# Patient Record
Sex: Female | Born: 1960 | Race: White | Hispanic: No | Marital: Married | Smoking: Never smoker
Health system: Southern US, Academic
[De-identification: ages and names within clinical notes are randomized; demographics above are authoritative.]

## PROBLEM LIST (undated history)

## (undated) DIAGNOSIS — F32A Depression, unspecified: Secondary | ICD-10-CM

## (undated) DIAGNOSIS — E119 Type 2 diabetes mellitus without complications: Secondary | ICD-10-CM

## (undated) DIAGNOSIS — K219 Gastro-esophageal reflux disease without esophagitis: Secondary | ICD-10-CM

## (undated) DIAGNOSIS — I639 Cerebral infarction, unspecified: Secondary | ICD-10-CM

## (undated) DIAGNOSIS — E7800 Pure hypercholesterolemia, unspecified: Secondary | ICD-10-CM

## (undated) DIAGNOSIS — G4730 Sleep apnea, unspecified: Secondary | ICD-10-CM

## (undated) DIAGNOSIS — I10 Essential (primary) hypertension: Secondary | ICD-10-CM

## (undated) HISTORY — DX: Type 2 diabetes mellitus without complications: E11.9

## (undated) HISTORY — DX: Sleep apnea, unspecified: G47.30

## (undated) HISTORY — DX: Essential (primary) hypertension: I10

## (undated) HISTORY — PX: MOLE REMOVAL: SHX2046

## (undated) HISTORY — PX: HX WISDOM TEETH EXTRACTION: SHX21

## (undated) HISTORY — DX: Pure hypercholesterolemia, unspecified: E78.00

## (undated) HISTORY — PX: HX TONSILLECTOMY: SHX27

## (undated) NOTE — Progress Notes (Signed)
 Formatting of this note is different from the original.  Subjective   Patient ID: Andrea Huerta is a 74 y.o. female presenting to the Urgent Care with a chief complaint of Nasal Congestion (Body aches and head feels foggy. Reports taking expired at home covid test that came back positive. ).    Mild cough and scratchy throat x today.  No fever.    History provided by:  Patient    Objective   BP 141/87 (BP Location: Left arm, Patient Position: Sitting, BP Cuff Size: Adult)   Pulse 100   Temp 36.8 C (98.2 F) (Temporal)   Resp 16   Ht 1.575 m (5' 2)   Wt 86.6 kg (191 lb)   SpO2 94%   BMI 34.93 kg/m     Physical Exam  Vitals and nursing note reviewed.   Constitutional:       General: She is not in acute distress.     Appearance: Normal appearance. She is not ill-appearing or toxic-appearing.   HENT:      Head: Normocephalic and atraumatic.      Right Ear: Tympanic membrane, ear canal and external ear normal.      Left Ear: Tympanic membrane, ear canal and external ear normal.      Mouth/Throat:      Mouth: Mucous membranes are moist.      Pharynx: Oropharynx is clear. Posterior oropharyngeal erythema present. No oropharyngeal exudate.   Cardiovascular:      Rate and Rhythm: Normal rate and regular rhythm.   Pulmonary:      Effort: Pulmonary effort is normal.      Breath sounds: Normal breath sounds.   Skin:     General: Skin is warm and dry.   Neurological:      Mental Status: She is alert.         Assessment & Plan    Assessment & Plan  COVID-19    Orders:    nirmatrelvir & ritonavir 20 x 150 MG & 10 x 100MG  tablet therapy pack; 3 tablets twice a day for 5 days.    Body aches    Orders:    POCT Covid Antigen    Congestion of nasal sinus            In-House Lab Results:     Results for orders placed or performed in visit on 03/06/24   POCT Covid Antigen    Collection Time: 03/06/24  7:06 PM   Result Value Ref Range    POCT Covid Antigen Positive (A) Negative       In-House Imaging Reads:        Procedure  Documentation:  Procedures     ED Course & MDM   MDM - Medical Decision Making: Home with return precautions  Electronically signed by Ozell Dallas Billing, MD at 03/06/2024  7:22 PM EDT

---

## 2010-04-10 ENCOUNTER — Other Ambulatory Visit: Payer: Self-pay

## 2010-04-13 LAB — HISTORICAL CYTOPATHOLOGY-GYN (PAP AND HPV TESTS)

## 2010-11-25 ENCOUNTER — Ambulatory Visit (HOSPITAL_COMMUNITY): Payer: Self-pay | Admitting: INTERNAL MEDICINE

## 2014-10-20 DIAGNOSIS — Z1272 Encounter for screening for malignant neoplasm of vagina: Secondary | ICD-10-CM

## 2014-10-20 HISTORY — DX: Encounter for screening for malignant neoplasm of vagina: Z12.72

## 2015-06-22 DIAGNOSIS — Z9289 Personal history of other medical treatment: Secondary | ICD-10-CM

## 2015-06-22 HISTORY — DX: Personal history of other medical treatment: Z92.89

## 2016-01-20 DIAGNOSIS — Z9289 Personal history of other medical treatment: Secondary | ICD-10-CM

## 2016-01-20 HISTORY — DX: Personal history of other medical treatment: Z92.89

## 2016-04-21 DIAGNOSIS — Z9289 Personal history of other medical treatment: Secondary | ICD-10-CM

## 2016-04-21 HISTORY — DX: Personal history of other medical treatment: Z92.89

## 2016-05-26 ENCOUNTER — Ambulatory Visit (INDEPENDENT_AMBULATORY_CARE_PROVIDER_SITE_OTHER): Payer: Self-pay | Admitting: Family Medicine

## 2016-07-15 ENCOUNTER — Encounter (INDEPENDENT_AMBULATORY_CARE_PROVIDER_SITE_OTHER): Payer: Self-pay | Admitting: Family Medicine

## 2016-07-15 ENCOUNTER — Ambulatory Visit (INDEPENDENT_AMBULATORY_CARE_PROVIDER_SITE_OTHER): Payer: No Typology Code available for payment source | Admitting: Family Medicine

## 2016-07-15 ENCOUNTER — Other Ambulatory Visit: Payer: No Typology Code available for payment source | Attending: Family Medicine

## 2016-07-15 VITALS — BP 130/82 | HR 96 | Ht 62.0 in | Wt 212.0 lb

## 2016-07-15 DIAGNOSIS — Z124 Encounter for screening for malignant neoplasm of cervix: Secondary | ICD-10-CM

## 2016-07-15 DIAGNOSIS — K219 Gastro-esophageal reflux disease without esophagitis: Secondary | ICD-10-CM

## 2016-07-15 DIAGNOSIS — F329 Major depressive disorder, single episode, unspecified: Secondary | ICD-10-CM

## 2016-07-15 DIAGNOSIS — I10 Essential (primary) hypertension: Principal | ICD-10-CM

## 2016-07-15 DIAGNOSIS — E785 Hyperlipidemia, unspecified: Secondary | ICD-10-CM

## 2016-07-15 DIAGNOSIS — F32A Depression, unspecified: Secondary | ICD-10-CM

## 2016-07-15 DIAGNOSIS — E119 Type 2 diabetes mellitus without complications: Secondary | ICD-10-CM

## 2016-07-15 NOTE — Progress Notes (Signed)
[facility]  [address]  [place] [address]  [phone]    07/16/2016    Andrea Huerta  December 03, 1960    Chief Complaint   Patient presents with   . Medical Information Needed     Previous PCP Dr. Precious Gilding saw her one time.    . Annual Pap     Pt has not had pap and mentioned it when she called in for her appointment wanting to know if we could do this.   . Annual Exam     Pt has PEIA would like to make this an annual physical.       HPI  Used to see Dr. Naida Sleight but had PEIA, had to move.  Went and saw Dr. Precious Gilding once.    Married for 30 years, 2 adult children.    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    Specialists: none.    On sertraline for anxiety.  Taking 75 mg.  Trying to wean off of this, now taking about 50 mg.  Tried going off of this before but had withdrawal side effects. Now on 50 mg for about 1 month.  She feels that her mood is okay.  She doesn't feel that she is ready to wean more.  She has been on 75 mg for about 15 years.    On lisinopril for hypertention.    On metformin for diabetes.  Tried byetta.  Went on this to try and lose weight.  Did not help, did not want to inject.     Lat pap smear was May/June 2016. Has had an abnormal pap smear.  Last menstrual cycle was over 1 year ago. She is hoping to be able to have this today. She did have hormone testing to see if she was menopausal.      Last labs in April 2017.  Her labs were available today.   Hemoglobin A1c was 6.1.  LDL was 93.  FSH 44.9, a low H 18.9 an estradiol 17.3    She was supposed to have Cologuard taken care of, never completed.  Has the kit at home.  Wants to know what she should do.    Past Medical History  Current Outpatient Prescriptions   Medication Sig   . ASCORBIC ACID (VITAMIN C ORAL) Once a day   . ASPIRIN (ASPIR-81 ORAL) Once a day   . CRANBERRY ORAL Once a day   . CYANOCOBALAMIN, VITAMIN B-12, (VITAMIN B-12  ORAL) Once a day   . GLUCOSAM/GLUC SU/AC-ALP-D-GLUC (GLUCOSAMINE COMPLEX ORAL) Once a day   . lisinopril (PRINIVIL) 20 mg Oral Tablet Take 1 Tab by mouth Once a day   . metFORMIN (GLUCOPHAGE) 500 mg Oral Tablet Take 1 Tab by mouth Twice daily with food   . MULTIVITAMIN (MULTIPLE VITAMINS ORAL) Once a day   . raNITIdine (ZANTAC) 300 mg Oral Tablet Take 1 Tab by mouth Once a day   . sertraline (ZOLOFT) 50 mg Oral Tablet Take 50 mg by mouth Once a day   . simvastatin (ZOCOR) 40 mg Oral Tablet Take 1 Tab by mouth Once a day   . vitamin E 100 unit Oral Capsule Take 100 Units by mouth Once a day     No Known Allergies  Past Medical History:   Diagnosis Date   . Diabetes mellitus, type 2 ([place])    . H/O complete eye exam 01/2016   .  H/O mammogram 2017   . High cholesterol    . History of dental examination 04/2016   . Hypertension    . Sleep apnea    . Vaginal Pap smear 10/2014         Past Surgical History:   Procedure Laterality Date   . HX CESAREAN SECTION     . HX TONSILLECTOMY     . HX WISDOM TEETH EXTRACTION     . MOLE REMOVAL      several all normal         Family Medical History     Problem Relation (Age of Onset)    Breast Cancer Mother    Diabetes Mother, Maternal Grandfather    High Cholesterol Mother, Brother    Hypertension Mother, Father, Brother    Liver Cancer Father            Social History     Social History   . Marital status: Married     Spouse name: N/A   . Number of children: N/A   . Years of education: N/A     Occupational History   . 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 Smoking status: Never Smoker   . Smokeless tobacco: Never Used   . Alcohol use Yes      Comment: 1 or 2 drinks per week   . Drug use: Not on file   . Sexual activity: Not on file     Other Topics Concern   . Not on file     Social History Narrative       BP 130/82  Pulse 96  Ht 1.575 m (5' 2")  Wt 96.2 kg (212 lb)  SpO2 97%  BMI 38.78 kg/m2    Nursing Notes:   Electa Sniff, [place]  07/15/16 1336  Signed      07/15/16 1336   Depression Screen   Little interest or pleasure in doing things. 0   Feeling down, depressed, or hopeless 0   PHQ 2 Total 0       Review of Systems   Constitutional: Negative for chills, fever and malaise/fatigue.   HENT: Negative for congestion, ear discharge, ear pain and tinnitus.    Eyes: Negative for double vision, discharge and redness.   Respiratory: Negative for cough, hemoptysis, sputum production, shortness of breath and wheezing.    Cardiovascular: Negative for chest pain, palpitations and orthopnea.   Gastrointestinal: Negative for constipation, diarrhea, nausea and vomiting.   Genitourinary: Negative for hematuria.   Musculoskeletal: Negative for myalgias.   Skin: Negative for rash.   Neurological: Negative for dizziness, tingling, tremors, weakness and headaches.   Psychiatric/Behavioral: Positive for depression. Negative for suicidal ideas. The patient is not nervous/anxious.    All other systems reviewed and are negative.      Physical Exam   Constitutional: She is oriented to person, place, and time and well-developed, well-nourished, and in no distress. No distress.   HENT:   Head: Normocephalic and atraumatic.   Eyes: EOM are normal.   Neck: Neck supple. No thyromegaly present.   Cardiovascular: Normal rate, regular rhythm and normal heart sounds.    No murmur heard.  Pulmonary/Chest: Effort normal and breath sounds normal. No respiratory distress. She has no wheezes. She has no rales. Right breast exhibits no inverted nipple, no mass and no nipple discharge. Left breast exhibits no inverted nipple, no mass and no nipple discharge.   Abdominal: Soft.  Bowel sounds are normal. She exhibits no distension. There is no tenderness.   Genitourinary: Uterus normal, cervix normal, right adnexa normal and left adnexa normal. Right adnexum displays no mass. Left adnexum displays no mass. Vagina exhibits no lesion.   Genitourinary Comments: Electa Sniff, CMA, present for exam.    Musculoskeletal: Normal range of motion. She exhibits no edema.   Neurological: She is alert and oriented to person, place, and time. Coordination normal.   Skin: Skin is warm. No rash noted. She is not diaphoretic. No cyanosis. Nails show no clubbing.   Psychiatric: Mood and affect normal.   Nursing note and vitals reviewed.        ICD-10-CM    1. Essential hypertension I10    2. Hyperlipidemia, unspecified hyperlipidemia type E78.5 COMPREHENSIVE METABOLIC PANEL, NON-FASTING     LIPID PANEL   3. Gastroesophageal reflux disease, esophagitis presence not specified K21.9    4. Diabetes mellitus (HCC) E11.9 HGA1C (HEMOGLOBIN A1C WITH EST AVG GLUCOSE)     MICROALBUMIN URINE, RANDOM   5. Pap smear for cervical cancer screening Z12.4 CYTOPATHOLOGY-GYN (PAP AND HPV TESTS)   6. Depression, unspecified depression type F32.9      He is controlled.  He should remain on her current medications.  She is due for lab work and I have ordered this for her.  She had a PE IA form.  She had this will be completed once her labs have resulted.  Pap smear was completed today.  She can continue on Zoloft 50 mg and she is not ready to taper at this time.  She has a Cologuard kit at home and was advised to complete this.She did not want to undergo colonoscopy.  Did advise her that I would not be able to bill today is a physical exam as we had a go over all of her problems today, she was okay with this.    Return in about 6 months (around 01/12/2017) for dm f/u.    Micki Riley, MD     I have reviewed this note in its entirety and agree with clinical staff on their assessments. Patient consented and agreed to the course of treatment. I have counseled patient and answered all questions.  The patient was informed to contact the office within 7 business days if a message/lab results/referral/imaging results have not been conveyed to the patient.

## 2016-07-15 NOTE — Nursing Note (Signed)
07/15/16 1336   Depression Screen   Little interest or pleasure in doing things. 0   Feeling down, depressed, or hopeless 0   PHQ 2 Total 0

## 2016-07-22 ENCOUNTER — Other Ambulatory Visit (INDEPENDENT_AMBULATORY_CARE_PROVIDER_SITE_OTHER): Payer: Self-pay | Admitting: Family Medicine

## 2016-07-22 LAB — HISTORICAL CYTOPATHOLOGY-GYN (PAP AND HPV TESTS)

## 2016-07-22 MED ORDER — METRONIDAZOLE 500 MG TABLET
500.00 mg | ORAL_TABLET | Freq: Two times a day (BID) | ORAL | 0 refills | Status: AC
Start: 2016-07-22 — End: 2016-07-29

## 2016-07-26 LAB — HUMAN PAPILLOMA VIRUS (HPV) BY PCR WITH HIGH RISK GENOTYPING (THINPREP)
HPV OTHER: NEGATIVE
HPV16 PCR: NEGATIVE
HPV18 PCR: NEGATIVE

## 2016-08-14 ENCOUNTER — Ambulatory Visit: Payer: No Typology Code available for payment source

## 2016-08-14 DIAGNOSIS — Z0000 Encounter for general adult medical examination without abnormal findings: Secondary | ICD-10-CM

## 2016-08-14 LAB — COMPREHENSIVE METABOLIC PNL, FASTING
ALBUMIN: 4.4 g/dL (ref 3.6–4.8)
ALKALINE PHOSPHATASE: 77 U/L (ref 38–126)
ALT (SGPT): 44 U/L (ref 3–45)
ANION GAP: 12 mmol/L
AST (SGOT): 23 U/L (ref 7–56)
BILIRUBIN TOTAL: 0.3 mg/dL (ref 0.2–1.3)
BUN/CREA RATIO: 23
BUN: 14 mg/dL (ref 7–18)
CALCIUM: 10 mg/dL (ref 8.5–10.3)
CHLORIDE: 104 mmol/L (ref 101–111)
CO2 TOTAL: 26 mmol/L (ref 22–31)
CREATININE: 0.6 mg/dL (ref 0.50–1.20)
ESTIMATED GFR: 104 mL/min/1.73mˆ2 (ref >=60)
GLUCOSE: 130 mg/dL — ABNORMAL HIGH (ref 68–99)
POTASSIUM: 4.9 mmol/L (ref 3.6–5.0)
PROTEIN TOTAL: 7 g/dL (ref 6.2–8.0)
SODIUM: 142 mmol/L (ref 137–145)

## 2016-08-14 LAB — CBC
HCT: 43 % (ref 37.0–47.0)
HGB: 14.4 g/dL (ref 12.0–16.0)
MCH: 30 pg (ref 27.0–31.0)
MCHC: 33.5 g/dL (ref 33.0–37.0)
MCV: 89.5 fL (ref 80.0–99.0)
PLATELETS: 243 x10ˆ3/uL (ref 130–400)
RBC: 4.8 x10?6/uL (ref 4.20–5.40)
RDW: 12.7 % (ref 11.5–14.5)
WBC: 3.9 x10?3/uL — ABNORMAL LOW (ref 4.8–10.8)

## 2016-08-14 LAB — LIPID PANEL
CHOLESTEROL: 170 mg/dL (ref 130–199)
HDL CHOL: 51 mg/dL (ref 40–59)
LDL CALC: 94 mg/dL (ref 60–129)
TRIGLYCERIDES: 123 mg/dL (ref 30–199)

## 2016-08-14 LAB — THYROID STIMULATING HORMONE (SENSITIVE TSH): TSH: 1.8 u[IU]/mL (ref 0.465–4.680)

## 2016-08-14 LAB — C-REACTIVE PROTEIN(CRP),HIGH SENSITIVITY,CARDIAC: CRP CARDIAC MARKER: 2 mg/L (ref 0.0–3.0)

## 2016-08-14 LAB — HGA1C (HEMOGLOBIN A1C WITH EST AVG GLUCOSE): HEMOGLOBIN A1C: 6.9 % — ABNORMAL HIGH (ref <5.7)

## 2016-08-30 ENCOUNTER — Other Ambulatory Visit (INDEPENDENT_AMBULATORY_CARE_PROVIDER_SITE_OTHER): Payer: Self-pay | Admitting: Family Medicine

## 2016-08-30 MED ORDER — LISINOPRIL 20 MG TABLET
20.0000 mg | ORAL_TABLET | Freq: Every day | ORAL | 1 refills | Status: DC
Start: 2016-08-30 — End: 2017-01-10

## 2016-08-30 NOTE — Telephone Encounter (Signed)
Regarding: Dr. Anne Fu  ----- Message from Marquette Old sent at 08/30/2016 11:48 AM EDT -----  Patient is requesting a refill on  lisinopril (PRINIVIL) 20 mg Oral Tablet, Take 1 Tab by mouth Once a day #90 with 1 refill

## 2016-10-09 ENCOUNTER — Other Ambulatory Visit (INDEPENDENT_AMBULATORY_CARE_PROVIDER_SITE_OTHER): Payer: Self-pay | Admitting: Family Medicine

## 2017-01-10 ENCOUNTER — Other Ambulatory Visit (INDEPENDENT_AMBULATORY_CARE_PROVIDER_SITE_OTHER): Payer: Self-pay | Admitting: Family Medicine

## 2017-01-12 ENCOUNTER — Ambulatory Visit (INDEPENDENT_AMBULATORY_CARE_PROVIDER_SITE_OTHER): Payer: No Typology Code available for payment source | Admitting: Family Medicine

## 2017-01-12 ENCOUNTER — Encounter (INDEPENDENT_AMBULATORY_CARE_PROVIDER_SITE_OTHER): Payer: Self-pay | Admitting: Family Medicine

## 2017-01-12 ENCOUNTER — Telehealth (INDEPENDENT_AMBULATORY_CARE_PROVIDER_SITE_OTHER): Payer: Self-pay | Admitting: Family Medicine

## 2017-01-12 ENCOUNTER — Other Ambulatory Visit (INDEPENDENT_AMBULATORY_CARE_PROVIDER_SITE_OTHER): Payer: Self-pay | Admitting: Family Medicine

## 2017-01-12 VITALS — BP 142/84 | HR 93 | Ht 62.0 in | Wt 209.0 lb

## 2017-01-12 DIAGNOSIS — N951 Menopausal and female climacteric states: Secondary | ICD-10-CM

## 2017-01-12 DIAGNOSIS — E119 Type 2 diabetes mellitus without complications: Secondary | ICD-10-CM

## 2017-01-12 DIAGNOSIS — I10 Essential (primary) hypertension: Secondary | ICD-10-CM

## 2017-01-12 DIAGNOSIS — F419 Anxiety disorder, unspecified: Secondary | ICD-10-CM

## 2017-01-12 DIAGNOSIS — Z1211 Encounter for screening for malignant neoplasm of colon: Secondary | ICD-10-CM

## 2017-01-12 DIAGNOSIS — Z1212 Encounter for screening for malignant neoplasm of rectum: Secondary | ICD-10-CM

## 2017-01-12 DIAGNOSIS — Z1231 Encounter for screening mammogram for malignant neoplasm of breast: Secondary | ICD-10-CM

## 2017-01-12 MED ORDER — BUSPIRONE 5 MG TABLET
5.00 mg | ORAL_TABLET | Freq: Three times a day (TID) | ORAL | 1 refills | Status: DC | PRN
Start: 2017-01-12 — End: 2017-03-07

## 2017-01-12 MED ORDER — ESTRADIOL 0.01% (0.1 MG/GRAM) VAGINAL CREAM: 2 g | Tube | VAGINAL | 1 refills | 0 days | Status: DC

## 2017-01-12 MED ORDER — SIMVASTATIN 40 MG TABLET
40.0000 mg | ORAL_TABLET | Freq: Every day | ORAL | 3 refills | Status: DC
Start: 2017-01-12 — End: 2017-10-10

## 2017-01-12 MED ORDER — RANITIDINE 300 MG TABLET
300.0000 mg | ORAL_TABLET | Freq: Every day | ORAL | 3 refills | Status: DC
Start: 2017-01-12 — End: 2017-10-10

## 2017-01-12 MED ORDER — BLOOD SUGAR DIAGNOSTIC STRIPS
ORAL_STRIP | 1 refills | Status: DC
Start: 2017-01-12 — End: 2020-12-31

## 2017-01-12 MED ORDER — LISINOPRIL 20 MG TABLET
ORAL_TABLET | ORAL | 3 refills | Status: DC
Start: 2017-01-12 — End: 2018-02-16

## 2017-01-12 MED ORDER — ESCITALOPRAM 20 MG TABLET
20.0000 mg | ORAL_TABLET | Freq: Every day | ORAL | 1 refills | Status: DC
Start: 2017-01-12 — End: 2017-07-07

## 2017-01-12 MED ORDER — METFORMIN 500 MG TABLET
500.0000 mg | ORAL_TABLET | Freq: Two times a day (BID) | ORAL | 3 refills | Status: DC
Start: 2017-01-12 — End: 2018-01-03

## 2017-01-12 NOTE — Telephone Encounter (Signed)
Regarding: Dr. Anne Fu  ----- Message from Myna Hidalgo sent at 01/12/2017 12:11 PM EDT -----  Current Outpatient Prescriptions:  raNITIdine (ZANTAC) 300 mg Oral Tablet, Take 1 Tab by mouth Once a day  simvastatin (ZOCOR) 40 mg Oral Tablet, Take 1 Tab by mouth Once a day

## 2017-01-12 NOTE — Progress Notes (Signed)
[facility]  [address]  [place] [address]  [phone]    01/14/2017    Andrea Huerta  Jan 04, 1961    Chief Complaint   Patient presents with   . Diabetes     Pt not checking daily out of strips. pt would like to have A1C done today.   . Depression     Pt thinks needs increase in medication. Pt having increased anxiety   . Vaginal Itching     Pt has itching sometimes and has more vaginal dryness.       HPI     She is here for routine follow up.      She is due for mammogram.     She has had tdap within the last 10 years.    SHe has been having some vaginal dryness and itching for the past month.  She has not been on anything over the counter. LMP 2-3 years ago.  Had pap smear several weeks ago.    She is currently on zoloft.  She has continued to try to taper.  Was on zoloft 50mg . Does not feel like she can discontinue zoloft at this time.  She has recently had some increased stress relating to her mother's ailing health.  She feels anxious at times.  Denies any suicidal ideations. SHe says that she was never on zoloft for depression, but rather, was on this for anxiety. At times, feels overly anxious.    Did not complete cologuard. She says that there was a mixup with the company and it still had her previous PCP name on the order and she did not want this result to go to the wrong doctor.    She has DM2, would like to have hga1c ordered.  She reports that her glucose levels are normally 150 or less but has not been checking because she ran out of test trips.  She is requesting a refill.  She denies any symptoms of hypoglycemia.      Past Medical History  Current Outpatient Prescriptions   Medication Sig   . ASCORBIC ACID (VITAMIN C ORAL) Once a day   . ASPIRIN (ASPIR-81 ORAL) Once a day   . Blood Sugar Diagnostic (ASCENSIA CONTOUR) Strip 1 Strip by Does not apply route Once a day   . Blood Sugar Diagnostic (ASCENSIA CONTOUR) Strip Test daily   . busPIRone (BUSPAR) 5 mg Oral Tablet  Take 1 Tab (5 mg total) by mouth Three times a day as needed   . CRANBERRY ORAL Once a day   . CYANOCOBALAMIN, VITAMIN B-12, (VITAMIN B-12 ORAL) Once a day   . escitalopram oxalate (LEXAPRO) 20 mg Oral Tablet Take 1 Tab (20 mg total) by mouth Once a day   . estradiol (ESTRACE) 0.01 % (0.1 mg/gram) Vaginal Cream 2 g by Vaginal route Every Monday, Wednesday and Friday   . GLUCOSAM/GLUC SU/AC-ALP-D-GLUC (GLUCOSAMINE COMPLEX ORAL) Once a day   . lisinopril (PRINIVIL) 20 mg Oral Tablet take 1 tablet by mouth once daily   . metFORMIN (GLUCOPHAGE) 500 mg Oral Tablet Take 1 Tab (500 mg total) by mouth Twice daily with food   . MULTIVITAMIN (MULTIPLE VITAMINS ORAL) Once a day   . raNITIdine (ZANTAC) 300 mg Oral Tablet Take 1 Tab (300 mg total) by mouth Once a day   . simvastatin (ZOCOR) 40 mg Oral Tablet Take 1 Tab (40 mg total) by mouth Once a day   . vitamin E 100 unit Oral  Capsule Take 100 Units by mouth Once a day     No Known Allergies  Past Medical History:   Diagnosis Date   . Diabetes mellitus, type 2 ([facility])    . H/O complete eye exam 01/2016   . H/O mammogram 2017   . High cholesterol    . History of dental examination 04/2016   . Hypertension    . Sleep apnea    . Vaginal Pap smear 10/2014         Past Surgical History:   Procedure Laterality Date   . HX CESAREAN SECTION     . HX TONSILLECTOMY     . HX WISDOM TEETH EXTRACTION     . MOLE REMOVAL      several all normal         Family Medical History     Problem Relation (Age of Onset)    Breast Cancer Mother    Diabetes Mother, Maternal Grandfather    High Cholesterol Mother, Brother    Hypertension Mother, Father, Brother    Liver Cancer Father            Social History     Social History   . Marital status: Married     Spouse name: N/A   . Number of children: N/A   . Years of education: N/A     Occupational History   . 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 Smoking status: Never Smoker   . Smokeless tobacco: Never Used   . Alcohol use Yes       Comment: 1 or 2 drinks per week   . Drug use: Not on file   . Sexual activity: Not on file     Other Topics Concern   . Not on file     Social History Narrative       BP (!) 142/84  Pulse 93  Ht 1.575 m (5\' 2" )  Wt 94.8 kg (209 lb)  SpO2 100%  BMI 38.23 kg/m2    Nursing Notes:   Electa Sniff, MA  01/12/17 1018  Signed     01/12/17 1000   A1C   A1C 6.4   References Ranges 4 - 6%   Initials SE       Review of Systems   Constitutional: Negative for chills, fever and malaise/fatigue.   HENT: Negative for congestion, ear discharge, ear pain and tinnitus.    Eyes: Negative for double vision, discharge and redness.   Respiratory: Negative for cough, hemoptysis, sputum production, shortness of breath and wheezing.    Cardiovascular: Negative for chest pain, palpitations and orthopnea.   Gastrointestinal: Negative for constipation, diarrhea, nausea and vomiting.   Genitourinary: Negative for hematuria.   Musculoskeletal: Negative for myalgias.   Skin: Negative for rash.   Neurological: Negative for dizziness, tingling, tremors, weakness and headaches.   Psychiatric/Behavioral: Negative for depression. The patient is nervous/anxious.    All other systems reviewed and are negative.      Physical Exam   Constitutional: She is oriented to person, place, and time and well-developed, well-nourished, and in no distress. No distress.   HENT:   Head: Normocephalic and atraumatic.   Eyes: EOM are normal.   Neck: Neck supple. No thyromegaly present.   Cardiovascular: Normal rate, regular rhythm and normal heart sounds.    No murmur heard.  Pulmonary/Chest: Effort normal and breath sounds normal. No respiratory distress. She has no wheezes. She  has no rales.   Musculoskeletal: Normal range of motion. She exhibits no edema.   Neurological: She is alert and oriented to person, place, and time. Coordination normal.   Skin: Skin is warm. No rash noted. She is not diaphoretic. No cyanosis. Nails show no clubbing.   Psychiatric:  Mood and affect normal.   Nursing note and vitals reviewed.        ICD-10-CM    1. Diabetes mellitus (CMS HCC) E11.9 HGA1C (HEMOGLOBIN A1C WITH EST AVG GLUCOSE)   2. Essential hypertension I10    3. Screening mammogram, encounter for Z12.31 MAMMO BILATERAL SCREENING-ADDL VIEWS/BREAST US AS REQ BY RAD   4. Anxiety F41.9    5. Vaginal dryness, menopausal N95.1    6. Screening for colorectal cancer Z12.11     Z12.12      Hemoglobin A1c was done in the office today this is 6.4.  She was encouraged to continue her current diabetic medications and work on a low carb ADA diet.  Blood pressure is controlled today.  Patient is due for mammogram.  In addition, she signed a new cologuard test order to have this done under my name.  Because of increased anxiety, recommended switching from Zoloft to Lexapro.  I did counsel her the possible side effects of the medication instructed her on how to make this switch.  She was prescribed BuSpar to be used as needed.  For her vaginal dryness, she has had a normal Pap smear and normal external exam recently.  I will start her on estrogen cream.  I did explain to her to try using this 3 times a week but then taper back to the lowest effective dose even if this means just once weekly.      Lab Requisition on 08/14/2016   Component Date Value Ref Range Status   . SODIUM 08/14/2016 142  137 - 145 mmol/L Final   . POTASSIUM 08/14/2016 4.9  3.6 - 5.0 mmol/L Final    Potassium Reference Ranges pertain to serum only.   Plasma results are 0.1 to 0.7 mmol/L lower than serum ranges.   . CHLORIDE 08/14/2016 104  101 - 111 mmol/L Final   . CO2 TOTAL 08/14/2016 26  22 - 31 mmol/L Final   . ANION GAP 08/14/2016 12  mmol/L Final   . BUN 08/14/2016 14  7 - 18 mg/dL Final   . CREATININE 08/14/2016 0.60  0.50 - 1.20 mg/dL Final   . BUN/CREA RATIO 08/14/2016 23   Final   . ESTIMATED GFR 08/14/2016 104  >=60 mL/min/1.30m^2 Final   . ALBUMIN 08/14/2016 4.4  3.6 - 4.8 g/dL Final   . CALCIUM 08/14/2016 10.0   8.5 - 10.3 mg/dL Final   . GLUCOSE 08/14/2016 130* 68 - 99 mg/dL Final   . ALKALINE PHOSPHATASE 08/14/2016 77  38 - 126 U/L Final   . ALT (SGPT) 08/14/2016 44  3 - 45 U/L Final   . AST (SGOT) 08/14/2016 23  7 - 56 U/L Final   . BILIRUBIN TOTAL 08/14/2016 0.3  0.2 - 1.3 mg/dL Final   . PROTEIN TOTAL 08/14/2016 7.0  6.2 - 8.0 g/dL Final   . WBC 08/14/2016 3.9* 4.8 - 10.8 x10^3/uL Final   . RBC 08/14/2016 4.80  4.20 - 5.40 x10^6/uL Final   . HGB 08/14/2016 14.4  12.0 - 16.0 g/dL Final   . HCT 08/14/2016 43.0  37.0 - 47.0 % Final   . MCV 08/14/2016 89.5  80.0 - 99.0 fL  Final   . MCH 08/14/2016 30.0  27.0 - 31.0 pg Final   . MCHC 08/14/2016 33.5  33.0 - 37.0 g/dL Final   . RDW 08/14/2016 12.7  11.5 - 14.5 % Final   . PLATELETS 08/14/2016 243  130 - 400 x10^3/uL Final   . TRIGLYCERIDES 08/14/2016 123  30 - 199 mg/dL Final    Triglyceride Classification:            <150    Normal       150-199 Borderline-High       200-499 High       >500    Very High   . CHOLESTEROL 08/14/2016 170  130 - 199 mg/dL Final    Total Cholesterol Classification:     <200        Desirable        200-239     Borderline High        > or = 240  High   . HDL CHOL 08/14/2016 51  40 - 59 mg/dL Final    HDL Cholesterol Classification:          <40        Low        > or = 60  High     . LDL CALC 08/14/2016 94  60 - 129 mg/dL Final    LDL Cholesterol Classification:         <100     Optimal       100-129  Near or Above Optimal       130-159  Borderline High       160-189  High       >190     Very High   . TSH 08/14/2016 1.800  0.465 - 4.680 uIU/mL Final    TSH Ranges  0 - 0.465 uIU/mL      Low  0.466 - 2.99 uIU/mL   Normal  3.00 - 4.68 uIU/mL    Borderline High  > 4.69 uIU/mL         High   . CRP CARDIAC MARKER 08/14/2016 2.0  0.0 - 3.0 mg/L Final    Classification for Cardiovascular Disease    Low:            < 1.0 mg/L  Average:        1.0 to 2.99 mg/L  High:           3.0 to 10.0 mg/L  Indeterminant:  > 10.0 mg/L   . HEMOGLOBIN A1C 08/14/2016 6.9*  <5.7 % Final    Hemoglobin A1c Ranges  Non-Diabetic:  <5.7  Increased Risk (pre-diabetic) for impaired glucose tolerance:  5.7 - 6.4  Consistent with diabetes (in not previously established diabetic patient:  > or = 6.5         No results were found from the past 30 days.        Micki Riley, MD     I have reviewed this note in its entirety and agree with clinical staff on their assessments. Patient consented and agreed to the course of treatment. I have counseled patient and answered all questions.  The patient was informed to contact the office within 7 business days if a message/lab results/referral/imaging results have not been conveyed to the patient.    This note may have been partially generated using MModal Fluency Direct system, and there may be some incorrect words, spellings, and punctuation that were not noted in checking the  note before saving, though effort was made to avoid such errors.

## 2017-01-12 NOTE — Telephone Encounter (Signed)
Patient states that he was supposed to have a cream sent to her pharmacy as well.    Georgena Spurling, [place]

## 2017-01-12 NOTE — Telephone Encounter (Signed)
I called Colog[employer] and they state that a new order needs to be sent in for new test and that the kit that the patient has is expired. Patient notified and states that she will come in and sign new form.    Andrea Huerta, [place]

## 2017-01-12 NOTE — Student (Signed)
Andrea Huerta is a 56 yo female presenting today for a routine follow up. She notes her glucose is ranging from 100 - 150 since her last visit but has requested an HbA1c run today in the office. She notes of no hypoglycemic instances since her last appt. Her last eye appt was last year and is going to schedule another one for this year. She has run out of test strips and has requested a refil today.     Her anxiety has increased since her last appointment with the stress of managing her mother's care recently. She feels her Zoloft 40m is no longer working as well.     Vaginal itching and dryness present for 2 months with mild intermittent dull pain due to scratching. LMP was 2-3 years ago and believes she has completed menopause. Denies dysuria or polyuria.     Believes her last mammogram was over 2 years ago and has requested to be scheduled for this year due to her mother hx of breast cancer.     She also notes of still having an unused Cologuard kit at home but did not know if she could use it since it was ordered y another physician. She has requested if we could switch the order to [place] she could complete this at this time. 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 SAnne Fuper the [employer] Discussed switching her Zoloft to Lexapro 242mwith addition of Buspirone 5 mg will help with her anxiety. Refilled her glucose test strips. Prescribed estrogen based cream for her atrophic vaginitis. Mammogram was ordered and instructed pt that hospital will call to set up the appt.  Follow up in 6-8 weeks or PRN.    LaThea SilversmithMED STUDENT       See my office note.  JeMicki RileyMD

## 2017-01-12 NOTE — Nursing Note (Signed)
01/12/17 1000   A1C   A1C 6.4   References Ranges 4 - 6%   Initials SE

## 2017-01-21 ENCOUNTER — Encounter (INDEPENDENT_AMBULATORY_CARE_PROVIDER_SITE_OTHER): Payer: Self-pay | Admitting: Family Medicine

## 2017-01-21 ENCOUNTER — Ambulatory Visit
Admission: RE | Admit: 2017-01-21 | Discharge: 2017-01-21 | Disposition: A | Discharge status: Home / Self Care | Payer: No Typology Code available for payment source | Source: Ambulatory Visit | Attending: Family Medicine | Admitting: Family Medicine

## 2017-01-21 ENCOUNTER — Encounter (HOSPITAL_BASED_OUTPATIENT_CLINIC_OR_DEPARTMENT_OTHER): Payer: Self-pay

## 2017-01-21 DIAGNOSIS — Z1231 Encounter for screening mammogram for malignant neoplasm of breast: Secondary | ICD-10-CM | POA: Insufficient documentation

## 2017-01-21 NOTE — Progress Notes (Signed)
Labs reviewed, unremarkable. Letter mailed.  Jalesa Thien Marie Staysha Truby, MD

## 2017-03-07 ENCOUNTER — Other Ambulatory Visit (INDEPENDENT_AMBULATORY_CARE_PROVIDER_SITE_OTHER): Payer: Self-pay | Admitting: Family Medicine

## 2017-03-09 ENCOUNTER — Ambulatory Visit (INDEPENDENT_AMBULATORY_CARE_PROVIDER_SITE_OTHER): Payer: No Typology Code available for payment source | Admitting: Family Medicine

## 2017-03-09 ENCOUNTER — Encounter (INDEPENDENT_AMBULATORY_CARE_PROVIDER_SITE_OTHER): Payer: Self-pay | Admitting: Family Medicine

## 2017-03-09 VITALS — BP 120/82 | HR 86 | Temp 98.1°F | Ht 62.0 in | Wt 213.0 lb

## 2017-03-09 DIAGNOSIS — F419 Anxiety disorder, unspecified: Secondary | ICD-10-CM

## 2017-03-09 DIAGNOSIS — N951 Menopausal and female climacteric states: Secondary | ICD-10-CM

## 2017-03-09 MED ORDER — ESTRADIOL 0.01% (0.1 MG/GRAM) VAGINAL CREAM
2.0000 g | TOPICAL_CREAM | VAGINAL | 1 refills | Status: DC
Start: 2017-03-09 — End: 2019-03-16

## 2017-03-09 NOTE — Nursing Note (Signed)
BMI addressed: Advised on diet, weight loss, and exercise to reduce above normal BMI.

## 2017-03-09 NOTE — Progress Notes (Signed)
[facility]  [address]  [place] [address]  [phone]    03/14/2017    Jackline Castilla  1960-09-22    Chief Complaint   Patient presents with    Follow Up Mood Check     pt states that her mood [place] vetter. Pt states that she has had some stressers like her mom being in the hospital 3 times over the summer and being rediagnosed with stage 1 breast cancer. Pt states that over all her mood is better with the new medication       HPI     Patient presents today for  Follow up of her mood.  At her last office visit less than 2 months ago, she was switched from zoloft to lexapro due to increased stress and anxiety.  She says that she has not been having any side effects and is happy with the switch.  She has not needed any buspar.     She was started on estrace cream for vaginal dryness. This has helped and has been taking it 3 times a week.  Wants to know if she can titrate this down a little in order to use the lowest effective dose.       Past Medical History  Current Outpatient Prescriptions   Medication Sig    ASPIRIN (ASPIR-81 ORAL) Once a day    Blood Sugar Diagnostic (ASCENSIA CONTOUR) Strip 1 Strip by Does not apply route Once a day    Blood Sugar Diagnostic (ASCENSIA CONTOUR) Strip Test daily    busPIRone (BUSPAR) 5 mg Oral Tablet take 1 tablet by mouth three times a day if needed    CRANBERRY ORAL Once a day    CYANOCOBALAMIN, VITAMIN B-12, (VITAMIN B-12 ORAL) Once a day    escitalopram oxalate (LEXAPRO) 20 mg Oral Tablet Take 1 Tab (20 mg total) by mouth Once a day    estradiol (ESTRACE) 0.01 % (0.1 mg/gram) Vaginal Cream 2 g by Vaginal route Every Monday, Wednesday and Friday    GLUCOSAM/GLUC SU/AC-ALP-D-GLUC (GLUCOSAMINE COMPLEX ORAL) Once a day    lisinopril (PRINIVIL) 20 mg Oral Tablet take 1 tablet by mouth once daily    metFORMIN (GLUCOPHAGE) 500 mg Oral Tablet Take 1 Tab (500 mg total) by mouth Twice daily with food    MULTIVITAMIN (MULTIPLE VITAMINS ORAL)  Once a day    raNITIdine (ZANTAC) 300 mg Oral Tablet Take 1 Tab (300 mg total) by mouth Once a day    simvastatin (ZOCOR) 40 mg Oral Tablet Take 1 Tab (40 mg total) by mouth Once a day    vitamin E 100 unit Oral Capsule Take 100 Units by mouth Once a day     No Known Allergies  Past Medical History:   Diagnosis Date    Diabetes mellitus, type 2 (CMS HCC)     H/O complete eye exam 01/2016    H/O mammogram 2017    High cholesterol     History of dental examination 04/2016    Hypertension     Sleep apnea     Vaginal Pap smear 10/2014         Past Surgical History:   Procedure Laterality Date    HX CESAREAN SECTION      HX TONSILLECTOMY      HX WISDOM TEETH EXTRACTION      MOLE REMOVAL      several all normal         Family Medical History  Problem Relation (Age of Onset)    Breast Cancer Mother    Diabetes Mother, Maternal Grandfather    High Cholesterol Mother, Brother    Hypertension Mother, Father, Brother    Liver Cancer Father    Lymphoma Maternal Aunt    Melanoma Maternal Aunt    No Known Problems Sister, Maternal Grandmother, Paternal Grandmother, Paternal Grandfather, Daughter, Son, Maternal Uncle, Paternal 27, Paternal Interior and spatial designer, Other            Social History     Social History    Marital status: Married     Spouse name: N/A    Number of children: N/A    Years of education: N/A     Occupational History    math Pharmacist, 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      Review of Systems   Constitutional: Negative for chills, fever and malaise/fatigue.   HENT: Negative for congestion, ear discharge, ear pain and tinnitus.    Eyes: Negative for double vision, discharge and redness.   Respiratory: Negative for cough, hemoptysis, sputum production, shortness of breath and wheezing.    Cardiovascular: Negative for chest pain, palpitations and orthopnea.   Gastrointestinal: Negative for constipation, diarrhea, nausea and vomiting.   Genitourinary: Negative for hematuria.   Musculoskeletal: Negative for myalgias.   Skin: Negative for rash.   Neurological: Negative for dizziness, tingling, tremors, weakness and headaches.   Psychiatric/Behavioral: Negative for depression. The patient is not nervous/anxious.    All other systems reviewed and are negative.      Physical Exam   Constitutional: She is oriented to person, place, and time and well-developed, well-nourished, and in no distress. No distress.   HENT:   Head: Normocephalic and atraumatic.   Eyes: EOM are normal.   Neck: Neck supple. No thyromegaly present.   Cardiovascular: Normal rate, regular rhythm and normal heart sounds.    No murmur heard.  Pulmonary/Chest: Effort normal and breath sounds normal. No respiratory distress. She has no wheezes. She has no rales.   Musculoskeletal: Normal range of motion. She exhibits no edema.   Neurological: She is alert and oriented to person, place, and time. Coordination normal.   Skin: Skin is warm. No rash noted. She is not diaphoretic. No cyanosis. Nails show no clubbing.   Psychiatric: Mood and affect normal.   Nursing note and vitals reviewed.        ICD-10-CM    1. Anxiety F41.9    2. Vaginal dryness, menopausal N95.1    1. Patient reports that her anxiety is better controlled.  Continue lexapro. May take buspar if needed.  2. Patient can use estradiol  cream less frequently and at half dose.  I explained to her that this is a cream that has a max dose and frequency and she can  choose to use a lot less, as long as is it effective for her.    No visits with results within 6 Month(s) from this visit.  Latest known visit with results is:    Lab Requisition on 08/14/2016   Component Date Value Ref Range Status    SODIUM 08/14/2016 142  137 - 145 mmol/L Final    POTASSIUM 08/14/2016 4.9  3.6 - 5.0 mmol/L Final    Potassium Reference Ranges pertain to serum only.   Plasma results are 0.1 to 0.7 mmol/L lower than serum ranges.    CHLORIDE 08/14/2016 104  101 - 111 mmol/L Final    CO2 TOTAL 08/14/2016 26  22 - 31 mmol/L Final    ANION GAP 08/14/2016 12  mmol/L Final    BUN 08/14/2016 14  7 - 18 mg/dL Final    CREATININE 08/14/2016 0.60  0.50 - 1.20 mg/dL Final    BUN/CREA RATIO 08/14/2016 23   Final    ESTIMATED GFR 08/14/2016 104  >=60 mL/min/1.41m2 Final    ALBUMIN 08/14/2016 4.4  3.6 - 4.8 g/dL Final    CALCIUM 08/14/2016 10.0  8.5 - 10.3 mg/dL Final    GLUCOSE 08/14/2016 130* 68 - 99 mg/dL Final    ALKALINE PHOSPHATASE 08/14/2016 77  38 - 126 U/L Final    ALT (SGPT) 08/14/2016 44  3 - 45 U/L Final    AST (SGOT) 08/14/2016 23  7 - 56 U/L Final    BILIRUBIN TOTAL 08/14/2016 0.3  0.2 - 1.3 mg/dL Final    PROTEIN TOTAL 08/14/2016 7.0  6.2 - 8.0 g/dL Final    WBC 08/14/2016 3.9* 4.8 - 10.8 x103/uL Final    RBC 08/14/2016 4.80  4.20 - 5.40 x106/uL Final    HGB 08/14/2016 14.4  12.0 - 16.0 g/dL Final    HCT 08/14/2016 43.0  37.0 - 47.0 % Final    MCV 08/14/2016 89.5  80.0 - 99.0 fL Final    MCH 08/14/2016 30.0  27.0 - 31.0 pg Final    MCHC 08/14/2016 33.5  33.0 - 37.0 g/dL Final    RDW 08/14/2016 12.7  11.5 - 14.5 % Final    PLATELETS 08/14/2016 243  130 - 400 x103/uL Final    TRIGLYCERIDES 08/14/2016 123  30 - 199 mg/dL Final    Triglyceride Classification:            <150    Normal       150-199 Borderline-High       200-499 High       >500    Very High    CHOLESTEROL 08/14/2016 170  130 - 199 mg/dL Final    Total Cholesterol Classification:     <200        Desirable         200-239     Borderline High        > or = 240  High    HDL CHOL 08/14/2016 51  40 - 59 mg/dL Final    HDL Cholesterol Classification:          <40        Low        > or = 60  High      LDL CALC 08/14/2016 94  60 - 129 mg/dL Final    LDL  Cholesterol Classification:         <100     Optimal       100-129  Near or Above Optimal       130-159  Borderline High       160-189  High       >190     Very High    TSH 08/14/2016 1.800  0.465 - 4.680 uIU/mL Final    TSH Ranges  0 - 0.465 uIU/mL      Low  0.466 - 2.99 uIU/mL   Normal  3.00 - 4.68 uIU/mL    Borderline High  > 4.69 uIU/mL         High    CRP CARDIAC MARKER 08/14/2016 2.0  0.0 - 3.0 mg/L Final    Classification for Cardiovascular Disease    Low:            < 1.0 mg/L  Average:        1.0 to 2.99 mg/L  High:           3.0 to 10.0 mg/L  Indeterminant:  > 10.0 mg/L    HEMOGLOBIN A1C 08/14/2016 6.9* <5.7 % Final    Hemoglobin A1c Ranges  Non-Diabetic:  <5.7  Increased Risk (pre-diabetic) for impaired glucose tolerance:  5.7 - 6.4  Consistent with diabetes (in not previously established diabetic patient:  > or = 6.5         No results were found from the past 30 days.    Return in about 4 months (around 07/09/2017) for dm, mood f/u .    Micki Riley, MD     I have reviewed this note in its entirety and agree with clinical staff on their assessments. Patient consented and agreed to the course of treatment. I have counseled patient and answered all questions.  The patient was informed to contact the office within 7 business days if a message/lab results/referral/imaging results have not been conveyed to the patient.    This note may have been partially generated using MModal Fluency Direct system, and there may be some incorrect words, spellings, and punctuation that were not noted in checking the note before saving, though effort was made to avoid such errors.

## 2017-03-14 ENCOUNTER — Other Ambulatory Visit (INDEPENDENT_AMBULATORY_CARE_PROVIDER_SITE_OTHER): Payer: Self-pay

## 2017-04-06 ENCOUNTER — Other Ambulatory Visit (INDEPENDENT_AMBULATORY_CARE_PROVIDER_SITE_OTHER): Payer: Self-pay | Admitting: Primary Care

## 2017-04-06 ENCOUNTER — Ambulatory Visit (INDEPENDENT_AMBULATORY_CARE_PROVIDER_SITE_OTHER): Payer: Self-pay | Admitting: Family Medicine

## 2017-04-06 NOTE — Telephone Encounter (Signed)
LM for patient. She is due for her flu shot this year. No record for this season's flu shot in chart.     Cruz Condon, LPN

## 2017-04-06 NOTE — Telephone Encounter (Signed)
Regarding: Dr. Anne Fu  ----- Message from [place] sent at 04/06/2017  8:15 AM EDT -----  Pt would like to know if she received her flu shot at her last visit on 9/19.  Call back number is [phone].

## 2017-04-06 NOTE — Telephone Encounter (Signed)
Chart states she is on lexapro now.     Louden Houseworth, APRN

## 2017-07-07 ENCOUNTER — Other Ambulatory Visit (INDEPENDENT_AMBULATORY_CARE_PROVIDER_SITE_OTHER): Payer: Self-pay | Admitting: Family Medicine

## 2017-07-20 ENCOUNTER — Encounter (INDEPENDENT_AMBULATORY_CARE_PROVIDER_SITE_OTHER): Payer: Self-pay | Admitting: Family Medicine

## 2017-07-21 ENCOUNTER — Other Ambulatory Visit (INDEPENDENT_AMBULATORY_CARE_PROVIDER_SITE_OTHER): Payer: Self-pay

## 2017-08-05 ENCOUNTER — Ambulatory Visit (INDEPENDENT_AMBULATORY_CARE_PROVIDER_SITE_OTHER): Payer: No Typology Code available for payment source | Admitting: Family Medicine

## 2017-08-05 ENCOUNTER — Encounter (INDEPENDENT_AMBULATORY_CARE_PROVIDER_SITE_OTHER): Payer: Self-pay | Admitting: Family Medicine

## 2017-08-05 VITALS — BP 120/88 | HR 91 | Temp 97.9°F | Ht 62.0 in | Wt 220.2 lb

## 2017-08-05 DIAGNOSIS — F419 Anxiety disorder, unspecified: Secondary | ICD-10-CM

## 2017-08-05 DIAGNOSIS — S058X1A Other injuries of right eye and orbit, initial encounter: Secondary | ICD-10-CM

## 2017-08-05 DIAGNOSIS — E119 Type 2 diabetes mellitus without complications: Secondary | ICD-10-CM

## 2017-08-05 DIAGNOSIS — E785 Hyperlipidemia, unspecified: Secondary | ICD-10-CM

## 2017-08-05 DIAGNOSIS — I10 Essential (primary) hypertension: Secondary | ICD-10-CM

## 2017-08-05 MED ORDER — CIPROFLOXACIN 0.3 % EYE DROPS
1.00 [drp] | OPHTHALMIC | 1 refills | Status: DC
Start: 2017-08-05 — End: 2019-03-16

## 2017-08-05 NOTE — Nursing Note (Signed)
BMI addressed: Advised on diet, weight loss, and exercise to reduce above normal BMI.

## 2017-08-05 NOTE — Progress Notes (Signed)
[facility]  [address]  [place] [address]  [phone]    08/05/2017    Andrea Huerta  February 13, 1961    Chief Complaint   Patient presents with   . Follow Up Mood Check     Pt wants to discuss increasing lexapro slightly.    . Diabetes   . Eye Pain     Pt feels she may have scratched her eye. Felt like a grain of sand in her eye at one point but its red now.        HPI     Last seen in September.  She is on lexapro for stress and anxiety.  She has tried zoloft. This did not help.  She continues to have some high stress levels related to trying to take care of her mother.  She says that she does not always have irritability and cranky missed and feel overwhelmed like she did prior to Lexapro.  She does not feel that she needs to have a med change necessary early but wanted to know if there is any chance of increasing the Lexapro.  She says that she has not been taking BuSpar.  She has not tried it.    She has type 2 diabetes.  She has not had recent labwork.  A hga1c was ordered in July 2018, not done.  Her last hga1c is from feb 2018, 6.9%.  She denies any symptoms of hypoglycemia.  She is not checking her glucose levels.  She remains on lisinopril 20 mg for blood pressure.  She does not check her blood pressure.  She is also on a statin medication and denies any myalgias.    She wears glasses all of the time, but yesterday, felt that something was in her eye, like a grain of sand.  Yesterday, was red.  Now feels itchy.  She sees Dr. Barbie Banner- across the street from [facility] (on [address]       Past Medical History  Current Outpatient Medications   Medication Sig   . ASPIRIN (ASPIR-81 ORAL) Once a day   . Blood Sugar Diagnostic (ASCENSIA CONTOUR) Strip 1 Strip by Does not apply route Once a day   . Blood Sugar Diagnostic (ASCENSIA CONTOUR) Strip Test daily   . busPIRone (BUSPAR) 5 mg Oral Tablet take 1 tablet by mouth three times a day if needed   . ciprofloxacin HCl (CILOXIN) 0.3 %  Ophthalmic Drops Instill 1 Drop into both eyes Every 2 hours   . CRANBERRY ORAL Once a day   . escitalopram oxalate (LEXAPRO) 20 mg Oral Tablet take 1 tablet by mouth once daily   . estradiol (ESTRACE) 0.01 % (0.1 mg/gram) Vaginal Cream 2 g by Vaginal route Every Monday, Wednesday and Friday   . GLUCOSAM/GLUC SU/AC-ALP-D-GLUC (GLUCOSAMINE COMPLEX ORAL) Once a day   . lisinopril (PRINIVIL) 20 mg Oral Tablet take 1 tablet by mouth once daily   . metFORMIN (GLUCOPHAGE) 500 mg Oral Tablet Take 1 Tab (500 mg total) by mouth Twice daily with food   . MULTIVITAMIN (MULTIPLE VITAMINS ORAL) Once a day   . raNITIdine (ZANTAC) 300 mg Oral Tablet Take 1 Tab (300 mg total) by mouth Once a day   . simvastatin (ZOCOR) 40 mg Oral Tablet Take 1 Tab (40 mg total) by mouth Once a day   . vitamin E 100 unit Oral Capsule Take 100 Units by mouth Once a day     No Known Allergies  Past  Medical History:   Diagnosis Date   . Diabetes mellitus, type 2 (CMS HCC)    . H/O complete eye exam 01/2016   . H/O mammogram 2017   . High cholesterol    . History of dental examination 04/2016   . Hypertension    . Sleep apnea    . Vaginal Pap smear 10/2014         Past Surgical History:   Procedure Laterality Date   . HX CESAREAN SECTION     . HX TONSILLECTOMY     . HX WISDOM TEETH EXTRACTION     . MOLE REMOVAL      several all normal         Family Medical History:     Problem Relation (Age of Onset)    Breast Cancer Mother    Diabetes Mother, Maternal Grandfather    High Cholesterol Mother, Brother    Hypertension Mother, Father, Brother    Liver Cancer Father    Lymphoma Maternal Aunt    Melanoma Maternal Aunt    No Known Problems Sister, Maternal Grandmother, Paternal Grandmother, Paternal 54, Daughter, Son, Maternal Uncle, Paternal 82, Paternal Uncle, Other            Social History     Socioeconomic History   . Marital status: Married     Spouse name: Not on file   . Number of children: Not on file   . Years of education: Not on file      . Highest education level: Not on file   Social Needs   . Financial resource strain: Not on file   . Food insecurity - worry: Not on file   . Food insecurity - inability: Not on file   . Transportation needs - medical: Not on file   . Transportation needs - non-medical: Not on file   Occupational History   . Occupation: math Buyer, retail   Tobacco Use   . Smoking status: Never Smoker   . Smokeless tobacco: Never Used   Substance and Sexual Activity   . Alcohol use: Yes     Comment: 1 or 2 drinks per week   . Drug use: Not on file   . Sexual activity: Not on file   Other Topics Concern   . Not on file   Social History Narrative   . Not on file       BP 120/88 (Site: Left)   Pulse 91   Temp 36.6 C (97.9 F) (Oral)   Ht 1.575 m (5\' 2" )   Wt 99.9 kg (220 lb 3.2 oz)   LMP  (LMP Unknown)   SpO2 99%   BMI 40.28 kg/m         Nursing Notes:   Adelfa Koh, [place]  08/05/17 0847  Signed  BMI addressed: Advised on diet, weight loss, and exercise to reduce above normal BMI.      Review of Systems   Constitutional: Negative for chills, fever and malaise/fatigue.   HENT: Negative for congestion, ear discharge, ear pain and tinnitus.    Eyes: Negative for double vision, discharge and redness.   Respiratory: Negative for cough, hemoptysis, sputum production, shortness of breath and wheezing.    Cardiovascular: Negative for chest pain, palpitations and orthopnea.   Gastrointestinal: Negative for constipation, diarrhea, nausea and vomiting.   Genitourinary: Negative for hematuria.   Musculoskeletal: Negative for myalgias.   Skin: Negative for rash.   Neurological: Negative for dizziness, tingling, tremors, weakness and  headaches.   Psychiatric/Behavioral: Negative for depression and suicidal ideas. The patient is nervous/anxious.    All other systems reviewed and are negative.      Physical Exam   Constitutional: She is oriented to person, place, and time. No distress.   HENT:   Head: Normocephalic and atraumatic.   Eyes:  EOM are normal.   Right eye- wood's lamp examination using flurosceine:  Patient has some uptake in lower mid conjunctiva   Neck: Neck supple. No thyromegaly present.   Cardiovascular: Normal rate, regular rhythm and normal heart sounds.   No murmur heard.  Pulmonary/Chest: Effort normal and breath sounds normal. No respiratory distress. She has no wheezes. She has no rales.   Musculoskeletal: Normal range of motion. She exhibits no edema.   Neurological: She is alert and oriented to person, place, and time. Coordination normal.   Diabetic foot exam:  Both feet without edema or ulcerations. Pulses normal bilaterally. Sensation normal bilaterally No edema bilaterally. No ulcerations or lesions bilaterally. Pulses normal bilaterally. Sensation normal bilaterally.          Skin: Skin is warm. No rash noted. She is not diaphoretic. No cyanosis. Nails show no clubbing.   Psychiatric: She has a normal mood and affect. Her behavior is normal.   Nursing note and vitals reviewed.        ICD-10-CM    1. Abrasion of sclera of right eye S05.8X1A    2. Essential hypertension I10    3. Anxiety F41.9    4. Diabetes mellitus (CMS HCC) E11.9 MICROALBUMIN URINE, RANDOM     HGA1C (HEMOGLOBIN A1C WITH EST AVG GLUCOSE)   5. Hyperlipidemia, unspecified hyperlipidemia type E78.5 COMPREHENSIVE METABOLIC PANEL, NON-FASTING     LIPID PANEL    I am starting patient on ciprofloxacin drops for her right eye.  Recommended that she call follow-up with her eye doctor within the next 1-2 days.  She verbalized understanding.  Blood pressure is controlled.  In terms of her anxiety, I informed her that 20 mg as a max dose of Lexapro.  I will for switch to Celexa but she declined.  I suggested then to try taking BuSpar 5 mg consistently every morning.  She says she will try this and call back in the next several weeks of this does not improve her anxiety.  Her she was encouraged to adhere to a low carb ADA diet.  She is past due for lab work and I will  adjust her medications accordingly.    No visits with results within 6 Month(s) from this visit.   Latest known visit with results is:   Lab Requisition on 08/14/2016   Component Date Value Ref Range Status   . SODIUM 08/14/2016 142  137 - 145 mmol/L Final   . POTASSIUM 08/14/2016 4.9  3.6 - 5.0 mmol/L Final    Potassium Reference Ranges pertain to serum only.   Plasma results are 0.1 to 0.7 mmol/L lower than serum ranges.   . CHLORIDE 08/14/2016 104  101 - 111 mmol/L Final   . CO2 TOTAL 08/14/2016 26  22 - 31 mmol/L Final   . ANION GAP 08/14/2016 12  mmol/L Final   . BUN 08/14/2016 14  7 - 18 mg/dL Final   . CREATININE 08/14/2016 0.60  0.50 - 1.20 mg/dL Final   . BUN/CREA RATIO 08/14/2016 23   Final   . ESTIMATED GFR 08/14/2016 104  >=60 mL/min/1.40m^2 Final   . ALBUMIN 08/14/2016 4.4  3.6 -  4.8 g/dL Final   . CALCIUM 08/14/2016 10.0  8.5 - 10.3 mg/dL Final   . GLUCOSE 08/14/2016 130* 68 - 99 mg/dL Final   . ALKALINE PHOSPHATASE 08/14/2016 77  38 - 126 U/L Final   . ALT (SGPT) 08/14/2016 44  3 - 45 U/L Final   . AST (SGOT) 08/14/2016 23  7 - 56 U/L Final   . BILIRUBIN TOTAL 08/14/2016 0.3  0.2 - 1.3 mg/dL Final   . PROTEIN TOTAL 08/14/2016 7.0  6.2 - 8.0 g/dL Final   . WBC 08/14/2016 3.9* 4.8 - 10.8 x10^3/uL Final   . RBC 08/14/2016 4.80  4.20 - 5.40 x10^6/uL Final   . HGB 08/14/2016 14.4  12.0 - 16.0 g/dL Final   . HCT 08/14/2016 43.0  37.0 - 47.0 % Final   . MCV 08/14/2016 89.5  80.0 - 99.0 fL Final   . MCH 08/14/2016 30.0  27.0 - 31.0 pg Final   . MCHC 08/14/2016 33.5  33.0 - 37.0 g/dL Final   . RDW 08/14/2016 12.7  11.5 - 14.5 % Final   . PLATELETS 08/14/2016 243  130 - 400 x10^3/uL Final   . TRIGLYCERIDES 08/14/2016 123  30 - 199 mg/dL Final    Triglyceride Classification:            <150    Normal       150-199 Borderline-High       200-499 High       >500    Very High   . CHOLESTEROL 08/14/2016 170  130 - 199 mg/dL Final    Total Cholesterol Classification:     <200        Desirable        200-239      Borderline High        > or = 240  High   . HDL CHOL 08/14/2016 51  40 - 59 mg/dL Final    HDL Cholesterol Classification:          <40        Low        > or = 60  High     . LDL CALC 08/14/2016 94  60 - 129 mg/dL Final    LDL Cholesterol Classification:         <100     Optimal       100-129  Near or Above Optimal       130-159  Borderline High       160-189  High       >190     Very High   . TSH 08/14/2016 1.800  0.465 - 4.680 uIU/mL Final    TSH Ranges  0 - 0.465 uIU/mL      Low  0.466 - 2.99 uIU/mL   Normal  3.00 - 4.68 uIU/mL    Borderline High  > 4.69 uIU/mL         High   . CRP CARDIAC MARKER 08/14/2016 2.0  0.0 - 3.0 mg/L Final    Classification for Cardiovascular Disease    Low:            < 1.0 mg/L  Average:        1.0 to 2.99 mg/L  High:           3.0 to 10.0 mg/L  Indeterminant:  > 10.0 mg/L   . HEMOGLOBIN A1C 08/14/2016 6.9* <5.7 % Final    Hemoglobin A1c Ranges  Non-Diabetic:  <5.7  Increased Risk (pre-diabetic) for impaired glucose tolerance:  5.7 - 6.4  Consistent with diabetes (in not previously established diabetic patient:  > or = 6.5         No results were found from the past 30 days.    Return in about 6 months (around 02/02/2018) for dm f/u .    Micki Riley, MD     I have reviewed this note in its entirety and agree with clinical staff on their assessments. Patient consented and agreed to the course of treatment. I have counseled patient and answered all questions.  The patient was informed to contact the office within 7 business days if a message/lab results/referral/imaging results have not been conveyed to the patient.    This note may have been partially generated using MModal Fluency Direct system, and there may be some incorrect words, spellings, and punctuation that were not noted in checking the note before saving, though effort was made to avoid such errors.

## 2017-09-06 ENCOUNTER — Encounter (INDEPENDENT_AMBULATORY_CARE_PROVIDER_SITE_OTHER): Payer: Self-pay | Admitting: Family Medicine

## 2017-09-06 NOTE — Nursing Note (Signed)
Patient is a diabetic.  States that she does not have an up to date dilated retinal eye exam. We discussed the reason/importance of having such exams.  Patient verbalized understanding and agreed.  States that she will schedule one as soon as possible.  FYI added to chart.  Gust Eugene, MA

## 2017-09-13 ENCOUNTER — Telehealth (INDEPENDENT_AMBULATORY_CARE_PROVIDER_SITE_OTHER): Payer: Self-pay | Admitting: Family Medicine

## 2017-09-13 NOTE — Telephone Encounter (Signed)
Per HEDIS guidelines the patient is DUE for DIABETIC EYE EXAM.  Prestina Raigoza, MA - [facility] Quality Management

## 2017-10-10 ENCOUNTER — Other Ambulatory Visit (INDEPENDENT_AMBULATORY_CARE_PROVIDER_SITE_OTHER): Payer: Self-pay | Admitting: Family Medicine

## 2017-10-10 NOTE — Telephone Encounter (Signed)
Patient was last seen in the office on 08/05/17.

## 2018-01-03 ENCOUNTER — Other Ambulatory Visit (INDEPENDENT_AMBULATORY_CARE_PROVIDER_SITE_OTHER): Payer: Self-pay | Admitting: Family Medicine

## 2018-01-23 ENCOUNTER — Encounter (INDEPENDENT_AMBULATORY_CARE_PROVIDER_SITE_OTHER): Payer: Self-pay | Admitting: Family Medicine

## 2018-02-16 ENCOUNTER — Other Ambulatory Visit (INDEPENDENT_AMBULATORY_CARE_PROVIDER_SITE_OTHER): Payer: Self-pay | Admitting: Family Medicine

## 2018-05-30 ENCOUNTER — Other Ambulatory Visit (INDEPENDENT_AMBULATORY_CARE_PROVIDER_SITE_OTHER): Payer: Self-pay

## 2018-05-30 ENCOUNTER — Encounter (INDEPENDENT_AMBULATORY_CARE_PROVIDER_SITE_OTHER): Payer: Self-pay | Admitting: Family Medicine

## 2018-11-25 ENCOUNTER — Other Ambulatory Visit: Payer: 59

## 2018-11-25 ENCOUNTER — Other Ambulatory Visit (HOSPITAL_BASED_OUTPATIENT_CLINIC_OR_DEPARTMENT_OTHER): Payer: Self-pay

## 2018-11-25 DIAGNOSIS — D2271 Melanocytic nevi of right lower limb, including hip: Secondary | ICD-10-CM | POA: Insufficient documentation

## 2018-11-28 ENCOUNTER — Other Ambulatory Visit: Payer: Self-pay

## 2018-11-28 ENCOUNTER — Other Ambulatory Visit: Payer: 59

## 2018-11-29 LAB — HISTORICAL SURGICAL PATHOLOGY SPECIMEN

## 2018-12-26 ENCOUNTER — Other Ambulatory Visit (HOSPITAL_BASED_OUTPATIENT_CLINIC_OR_DEPARTMENT_OTHER): Payer: Self-pay | Admitting: Family Medicine

## 2019-01-02 ENCOUNTER — Other Ambulatory Visit (HOSPITAL_BASED_OUTPATIENT_CLINIC_OR_DEPARTMENT_OTHER): Payer: Self-pay | Admitting: Family Medicine

## 2019-01-02 DIAGNOSIS — F419 Anxiety disorder, unspecified: Secondary | ICD-10-CM

## 2019-01-02 DIAGNOSIS — I10 Essential (primary) hypertension: Secondary | ICD-10-CM

## 2019-01-02 NOTE — Telephone Encounter (Signed)
Regarding: Dr. Russ Halo  ----- Message from Moses Manners sent at 01/02/2019 10:31 AM EDT -----   Patient requesting refill sent to [facility] [address] [place] [address] for:        This request is per Burman Nieves           #escitalopram oxalate (LEXAPRO) 20 mg Oral Tablet, take 1 tablet by mouth once daily     #simvastatin (ZOCOR) 40 mg Oral Tablet, take 1 tablet by mouth once daily         Appt:  03/16/19

## 2019-01-03 MED ORDER — ESCITALOPRAM 20 MG TABLET
ORAL_TABLET | ORAL | 0 refills | Status: DC
Start: 2019-01-03 — End: 2019-04-05

## 2019-01-03 MED ORDER — SIMVASTATIN 40 MG TABLET
ORAL_TABLET | ORAL | 0 refills | Status: DC
Start: 2019-01-03 — End: 2019-04-05

## 2019-01-05 ENCOUNTER — Ambulatory Visit (HOSPITAL_BASED_OUTPATIENT_CLINIC_OR_DEPARTMENT_OTHER): Payer: Self-pay | Admitting: PHYSICIAN ASSISTANT

## 2019-01-05 NOTE — Nursing Note (Signed)
this patients medication has been called into the pharmacy.      Andrea Huerta

## 2019-01-05 NOTE — Telephone Encounter (Signed)
Regarding: Dr. Sheppard Coil  ----- Message from Moses Manners sent at 01/05/2019  8:43 AM EDT -----  Patient called about this again today:  (She is requesting this medication be called to her pharmacy)            Patient requesting refill sent to [facility] [address] for:       This request is per Burman Nieves          #escitalopram oxalate (LEXAPRO) 20 mg Oral Tablet, take 1 tablet by mouth once daily    #simvastatin (ZOCOR) 40 mg Oral Tablet, take 1 tablet by mouth once daily        Appt: 03/16/19

## 2019-02-20 ENCOUNTER — Other Ambulatory Visit (HOSPITAL_BASED_OUTPATIENT_CLINIC_OR_DEPARTMENT_OTHER): Payer: Self-pay | Admitting: PHYSICIAN ASSISTANT

## 2019-02-20 MED ORDER — LISINOPRIL 20 MG TABLET
ORAL_TABLET | ORAL | 0 refills | Status: DC
Start: 2019-02-20 — End: 2019-02-22

## 2019-02-20 NOTE — Telephone Encounter (Signed)
Regarding: Andrea Huerta  ----- Message from Laurance Flatten sent at 02/20/2019  3:56 PM EDT -----  Med refill    #lisinopril (PRINIVIL) 20 mg Oral Tablet, take 1 tablet by mouth once daily    Preferred [place], [place] [address]   [facility] [address]    [place] [address]    Phone: [phone] Fax: [phone]    Not a 24 hour pharmacy; exact hours not known.

## 2019-02-22 ENCOUNTER — Other Ambulatory Visit (HOSPITAL_BASED_OUTPATIENT_CLINIC_OR_DEPARTMENT_OTHER): Payer: Self-pay | Admitting: PHYSICIAN ASSISTANT

## 2019-02-22 MED ORDER — LISINOPRIL 20 MG TABLET
ORAL_TABLET | ORAL | 0 refills | Status: DC
Start: 2019-02-22 — End: 2019-03-19

## 2019-02-22 NOTE — Telephone Encounter (Signed)
Regarding: Andrea Huerta  ----- Message from Laurance Flatten sent at 02/22/2019  3:41 PM EDT -----  Has not had prescription called in yet. Claims she called yesterday. Takes last pill tomorrow.    #lisinopriL (PRINIVIL) 20 mg Oral Tablet, Take 1 tablet by mouth once daily    Preferred [place], [place] [address]   [facility] [address]    [place] [address]    Phone: [phone] Fax: [phone]    Not a 24 hour pharmacy; exact hours not known.

## 2019-02-22 NOTE — Telephone Encounter (Signed)
After LITERALLY 25 minutes on hold with [facility] trying to find out if they received this Rx or not, I opted to simply resend it.  Thank you.

## 2019-03-15 ENCOUNTER — Other Ambulatory Visit: Payer: Self-pay

## 2019-03-16 ENCOUNTER — Ambulatory Visit (HOSPITAL_BASED_OUTPATIENT_CLINIC_OR_DEPARTMENT_OTHER): Payer: 59 | Admitting: PHYSICIAN ASSISTANT

## 2019-03-16 ENCOUNTER — Encounter (HOSPITAL_BASED_OUTPATIENT_CLINIC_OR_DEPARTMENT_OTHER): Payer: Self-pay | Admitting: PHYSICIAN ASSISTANT

## 2019-03-16 ENCOUNTER — Other Ambulatory Visit (HOSPITAL_BASED_OUTPATIENT_CLINIC_OR_DEPARTMENT_OTHER): Payer: 59

## 2019-03-16 ENCOUNTER — Ambulatory Visit: Payer: 59 | Attending: PHYSICIAN ASSISTANT

## 2019-03-16 ENCOUNTER — Ambulatory Visit (HOSPITAL_BASED_OUTPATIENT_CLINIC_OR_DEPARTMENT_OTHER): Payer: Self-pay | Admitting: PHYSICIAN ASSISTANT

## 2019-03-16 VITALS — BP 128/98 | HR 89 | Temp 97.9°F | Ht 62.0 in | Wt 220.0 lb

## 2019-03-16 DIAGNOSIS — Z7984 Long term (current) use of oral hypoglycemic drugs: Secondary | ICD-10-CM

## 2019-03-16 DIAGNOSIS — Z23 Encounter for immunization: Secondary | ICD-10-CM

## 2019-03-16 DIAGNOSIS — E119 Type 2 diabetes mellitus without complications: Secondary | ICD-10-CM | POA: Insufficient documentation

## 2019-03-16 DIAGNOSIS — Z9189 Other specified personal risk factors, not elsewhere classified: Secondary | ICD-10-CM

## 2019-03-16 DIAGNOSIS — Z7689 Persons encountering health services in other specified circumstances: Secondary | ICD-10-CM

## 2019-03-16 DIAGNOSIS — Z6841 Body Mass Index (BMI) 40.0 and over, adult: Secondary | ICD-10-CM | POA: Insufficient documentation

## 2019-03-16 DIAGNOSIS — F419 Anxiety disorder, unspecified: Secondary | ICD-10-CM | POA: Insufficient documentation

## 2019-03-16 DIAGNOSIS — Z1239 Encounter for other screening for malignant neoplasm of breast: Secondary | ICD-10-CM

## 2019-03-16 DIAGNOSIS — Z1159 Encounter for screening for other viral diseases: Secondary | ICD-10-CM | POA: Insufficient documentation

## 2019-03-16 DIAGNOSIS — N898 Other specified noninflammatory disorders of vagina: Secondary | ICD-10-CM

## 2019-03-16 DIAGNOSIS — I10 Essential (primary) hypertension: Secondary | ICD-10-CM | POA: Insufficient documentation

## 2019-03-16 DIAGNOSIS — F329 Major depressive disorder, single episode, unspecified: Secondary | ICD-10-CM

## 2019-03-16 DIAGNOSIS — F32A Depression, unspecified: Secondary | ICD-10-CM

## 2019-03-16 DIAGNOSIS — E785 Hyperlipidemia, unspecified: Secondary | ICD-10-CM

## 2019-03-16 DIAGNOSIS — Z114 Encounter for screening for human immunodeficiency virus [HIV]: Secondary | ICD-10-CM | POA: Insufficient documentation

## 2019-03-16 LAB — COMPREHENSIVE METABOLIC PNL, FASTING
ALBUMIN: 4.6 g/dL (ref 3.6–4.8)
ALKALINE PHOSPHATASE: 85 U/L (ref 38–126)
ALT (SGPT): 39 U/L (ref 3–45)
ANION GAP: 13 mmol/L
AST (SGOT): 27 U/L (ref 7–56)
BILIRUBIN TOTAL: 0.5 mg/dL (ref 0.2–1.3)
BUN/CREA RATIO: 21
BUN: 15 mg/dL (ref 7–18)
CALCIUM: 9.7 mg/dL (ref 8.5–10.3)
CHLORIDE: 103 mmol/L (ref 101–111)
CO2 TOTAL: 23 mmol/L (ref 22–31)
CREATININE: 0.7 mg/dL (ref 0.50–1.20)
ESTIMATED GFR: 97 mL/min/{1.73_m2} (ref >=60)
GLUCOSE: 221 mg/dL — ABNORMAL HIGH (ref 68–99)
POTASSIUM: 4.4 mmol/L (ref 3.6–5.0)
PROTEIN TOTAL: 7.1 g/dL (ref 6.2–8.0)
SODIUM: 139 mmol/L (ref 137–145)

## 2019-03-16 LAB — URINALYSIS, MICROSCOPIC

## 2019-03-16 LAB — URINALYSIS, MACROSCOPIC
BILIRUBIN: NOT DETECTED mg/dL
BLOOD: NOT DETECTED mg/dL
GLUCOSE: NOT DETECTED mg/dL
KETONES: NOT DETECTED mg/dL
NITRITE: NOT DETECTED
PH: 6 (ref 5.0–8.0)
PROTEIN: NOT DETECTED mg/dL
SPECIFIC GRAVITY: 1.008 (ref 1.005–1.030)
UROBILINOGEN: NOT DETECTED mg/dL

## 2019-03-16 LAB — CBC WITH DIFF
BASOPHIL #: 0 10*3/uL (ref 0.00–1.00)
BASOPHIL %: 1 % (ref 0–1)
EOSINOPHIL #: 0.1 10*3/uL (ref 0.00–0.40)
EOSINOPHIL %: 2 % (ref 1–5)
HCT: 41.1 % (ref 37.0–47.0)
HGB: 13.6 g/dL (ref 12.0–16.0)
LYMPHOCYTE #: 1.2 10*3/uL (ref 1.00–4.00)
LYMPHOCYTE %: 30 % (ref 25–40)
MCH: 30.1 pg (ref 27.0–31.0)
MCHC: 33.1 g/dL (ref 33.0–37.0)
MCV: 91 fL (ref 80.0–99.0)
MONOCYTE #: 0.3 10*3/uL (ref 0.10–0.80)
MONOCYTE %: 7 % (ref 4–10)
NEUTROPHIL #: 2.4 10*3/uL (ref 1.80–7.00)
NEUTROPHIL %: 60 % (ref 50–73)
PLATELETS: 206 10*3/uL (ref 130–400)
RBC: 4.52 10*6/uL (ref 4.20–5.40)
RDW: 13 % (ref 11.5–14.5)
WBC: 4 10*3/uL — ABNORMAL LOW (ref 4.8–10.8)

## 2019-03-16 LAB — LIPID PANEL
CHOLESTEROL: 173 mg/dL (ref 130–199)
HDL CHOL: 50 mg/dL (ref 40–59)
LDL CALC: 91 mg/dL (ref 60–129)
TRIGLYCERIDES: 159 mg/dL (ref 30–199)

## 2019-03-16 LAB — MICROALBUMIN/CREATININE RATIO, URINE, RANDOM
CREATININE RANDOM URINE: 42 mg/dL — ABNORMAL LOW (ref 80–180)
MICROALBUMIN RANDOM URINE: <0.6 mg/dL

## 2019-03-16 LAB — HIV1/HIV2 SCREEN, COMBINED ANTIGEN AND ANTIBODY: HIV 1/2 COMBO (CCM): NONREACTIVE

## 2019-03-16 LAB — HEPATITIS C ANTIBODY
HCV ANTIBODY QUALITATIVE: NONREACTIVE
HCV ANTIBODY QUANTITATIVE: 0.02 (ref <1.00)

## 2019-03-16 MED ORDER — BUSPIRONE 5 MG TABLET
5.00 mg | ORAL_TABLET | Freq: Three times a day (TID) | ORAL | 1 refills | Status: DC | PRN
Start: 2019-03-16 — End: 2022-12-02

## 2019-03-16 MED ORDER — METFORMIN 1,000 MG TABLET
1000.00 mg | ORAL_TABLET | Freq: Two times a day (BID) | ORAL | 1 refills | Status: DC
Start: 2019-03-16 — End: 2019-08-08

## 2019-03-16 NOTE — Telephone Encounter (Signed)
Regarding: Dr. Russ Halo  ----- Message from Myna Hidalgo sent at 03/16/2019  3:57 PM EDT -----  Patient states she has a question about the Urinalysis she had done today.  Patient would like for nurse to give her a call.

## 2019-03-16 NOTE — Patient Instructions (Signed)
Atrophic Vaginitis    Atrophic vaginitis means the walls of your vagina have become thin. This happens when your body makes too little of the hormone estrogen. Menopause or surgical removal of the ovaries are the most common causes for a drop in estrogen. Breastfeeding can also cause the hormone level to drop.   Symptoms of atrophic vaginitis include:   Dryness, soreness, burning, or itching in the vagina   Vaginal discharge  Sex can be uncomfortable, even painful. After sex, you may have bleeding from your vagina. You may also have burning or pain when you urinate.   Home care  Your healthcare provider may recommend one or more of these as treatment:    Vaginal creams, lotions, and lubricants.These products help relieve vaginal dryness. They don't need a prescription. They can be found in the personal care section of most pharmacies. Creams and lotions are used daily to help keep the vagina moist. Lubricants help reduce dryness and pain during sex. Choose water-based lubricants. Don't use petroleum jelly, mineral oil, or other oils. These increase the chance of infection.   Hormone therapy (HT).HT increases the amount of estrogen in your body. This can help manage or relieve symptoms. HT can be given in pill form. It may be given as a lotion, cream, ring put into the vagina, or a patch on the skin. The risks and benefits of HT vary for each woman. For instance, your risk may be higher if you have had breast cancer. Discuss this treatment with your healthcare provider. Not every woman can use HT.  You don't need to stop having sex. In fact, regular sex can help keep vaginal tissues healthy. Take steps to make sex more comfortable by using water-based lubricants.   Preventing infections  Atrophic vaginitis makes an infection of the vagina or the urinary tract more likely. To help reduce your risk:    Keep your genitals clean. When you bathe, wash the outside of your vagina with mild soap and water. Clean  gently between the folds of your vagina.   Wipe from front to back after a bowel movement.   Don't douche unless your healthcare provider tells you to.   Don't use scented toilet paper, scented vaginal sprays, or scented tampons.   Don't wear clothes that are tight in the crotch. These include pantyhose, jeans, and leggings. Wear cotton underwear. Change it every day.  Follow-up care  Follow up with your healthcare provider, or as advised.  When to seek medical advice  Call your health care provider right away if any of these occur:   Fever of 100.57F (38C) or higher, or as directed by your healthcare provider   Symptoms don't go away or get worse even with treatment   Vaginal area swells or becomes painful   Vaginal area bleeds, but not because of your period   Bad-smelling discharge from the vagina   Pain or burning when you urinate or you have trouble passing urine   Open sores develop around vagina  [employer]Well last reviewed this educational content on 03/21/2018   2000-2020 [employer]. [address], [place] [address]. All rights reserved. This information is not intended as a substitute for professional medical care. Always follow your healthcare professional's instructions.        Weight Management: Overcoming Your Barriers    You may have many reasons why you're not ready to lose weight. You may not feel you have the time or the skills. You may  be afraid of losing weight and gaining it back again. Well, you can lose weight. And you can keep the weight off, if you make changes slowly and stick with them.Remember that you may never find the perfect time to lose weight. Decide that the right time to be healthier is now.   Common barriers  Barrier 1: I don't want to deny myself.  Barrier Buster: You don't have to! Moderation is the key:   Watch portion sizes and know when you're eating more than one serving.   Plan to ask for a doggy bag when you eat out.   Have just  one.   Read the labels on foods to know what foods may be hiding calories or salt.   Choose lower-fat and lower-calorie versions of your favorites.   Use a small plate instead of a normal-sized plate.  Barrier 2: I lost weight before but I gained it right back.  Barrier Buster: Make this time different:   List what worked and Interior and spatial designer time and what you can try this time.   Choose changes that you are willing to stick with.   Work exercise into your weight-loss plan.   Be realistic about what is possible.Your plan has to fit into your life in a balanced way that works for you.  Barrier 3: I don't have the time to be active.  Barrier Buster: It takes just a few minutes a day!   Be active with a pet or the kids.   Block off activity time in your schedule.   Borrow some time that you usually spend watching TV.   You are too important not to take time to exercise--it is your life!  Feel good about yourself  Do you eat more because you feel bad about yourself, then feel even worse as you gain weight? This is a "vicious cycle." Breaking this cycle is not easy. You may need group support or counseling. Always remember that you are a valuable person, no matter what size or shape you are.   Do you have a health problem? If so, don't use it as an excuse for not losing weight. Ask your healthcare provider or dietitian about methods to lose weight that are safe for you. For example, even if you have severe arthritis, it may be easier for you to exercise in a pool. Get advice from a fitness professional.   Rolene Arbour last reviewed this educational content on 09/19/2016   2000-2020 [employer]. [address] [place] [address]. All rights reserved. This information is not intended as a substitute for professional medical care. Always follow your healthcare professional's instructions.

## 2019-03-16 NOTE — Telephone Encounter (Signed)
Checked her urinalysis, concerned about her abnormal levels. Told her as soon as it had been reviewed by doctor, someone would give her a call.     Hal Neer, LPN

## 2019-03-16 NOTE — Nursing Note (Signed)
03/16/19 0800   Urine test  (Siemens Multistix 10 SG)   Time collected 0836   Color Yellow   Clarity Cloudy   Glucose Negative   Bilirubin Negative   Ketones Negative   Urine Specific Gravity 1.015   Blood (urine) Negative   pH 5.5   Protein Negative   Urobilinogen 0.2mg /dL (Normal)   Nitrite Negative   Leukocytes (!) 1+   Initials ABR, LPN

## 2019-03-16 NOTE — Progress Notes (Signed)
My Chart message sent

## 2019-03-16 NOTE — Progress Notes (Signed)
[place] [place]  [place]  [place] [address]  [phone]    Patient Name: Andrea Huerta  Date: 03/16/2019  MRN: [id number]  DOB: 11-06-1960    Chief complaint: Establish Care (DMII, HTN, urinary symptoms)    HPI:  The patient is a 58 y.o. old female who came in today to establish care. She is a previous patient of Dr. Anne Fu. Patient is married and lives in [place], [place]. 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 Has 2 children, ages 24 and 38. 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 She has not been to see her previous PCP, Dr. Anne Fu, since 07/2017. Today, we discuss the following complaints:    Diabetes mellitus, type II: Last HbA1C was 6.9% on 08/14/2016. Patient denies symptoms of polydipsia, polyuria, hypoglycemic unawareness, chest pain, dyspnea on exertion, diarrhea, foot ulcerations, paresthesias. Symptoms are not changed. Patient's home blood sugars have been elevated. Patient is compliant with medications as prescribed, metformin 500 mg BID. She admits to noncompliance with a diabetic diet as well, and has gained weight since her last encounter with Dr. Anne Fu. Complications are not known to include retinopathy, nephropathy, or neuropathy. She does have multiple co-morbidities for ASCVD, including hyperlipidemia, obesity, and hypertension.     Essential Hypertension: Blood pressure today in office is BP (Non-Invasive): (!) 128/98. Patient denies symptoms of chest pain, dyspnea, dyspnea on exertion, lower extremity edema, tinnitus, headache, signs/symptoms of stroke, confusion, visual disturbance, dizziness. Patient is compliant with medications (lisinopril 20 mg daily), and reports no medication side effects.     Vaginal itching: Patient states that she has had intermittent burning with urination and vaginal "itching". She has not tried any OTC creams or medications for  her symptoms. She does not report fevers/chills, abdominal pain, nausea/vomiting, hematuria. She was previous taking estrogen cream. Reports some dyspareunia, but no bleeding after intercourse. Postmenopausal. She denies thick vaginal discharge. She worries about the side effects of estrogen supplementation. Last Pap was 07/15/2016, HPV and cytology were negative. She reports an abnormal Pap in the distant past, but last couple have been normal.     ROS:  Constitutional: Denies unintentional weight gain/loss, fever, chills, fatigue, tiredness  Skin: Denies skin problems or rashes, itching  Musculoskeletal: Denies unilateral muscle weakness, atrophy, joint pain/swelling  HEENT: Denies blurred vision, vision loss, nasal congestion, otalgia, hearing loss, epistaxis, sore throat, difficulty swallowing  Cardiovascular: Denies heart valve problems/murmurs, irregular heartbeat, angina/chest pain, swelling in feet, swelling in hands, shortness of breath lying flat, short of breath climbing stairs, palpitations  Respiratory: Denies shortness of breath, productive cough, hemoptysis, wheezing  Gastrointestinal: Denies abdominal pain, diarrhea, constipation, vomiting blood, black or tarry stools, nausea, GERD  Genitourinary: +burning with urination, vaginal itching. Denies hematuria, change in urinary stream, urinary frequency, incontinence, nocturia, sexual dysfunction  Neurologic: Denies headache, seizures, stroke, dizziness, loss of consciousness, paresthesias, numbness  Psychiatric: Denies anxiety, depression, memory loss, suicidal/homicidal thoughts, insomnia  Endocrine: Denies polydipsia, polyuria, thyroid problems, heat intolerance, cold intolerance    Past medical history:  Past Medical History:   Diagnosis Date   . Diabetes mellitus, type 2 (CMS HCC)    . H/O complete eye exam 01/2016   . H/O mammogram 2017   . High cholesterol    . History of dental examination 04/2016   . Hypertension    . Sleep apnea    . Vaginal Pap  smear 10/2014     Past surgical history:  Past Surgical History:   Procedure Laterality Date   . HX CESAREAN SECTION     . HX TONSILLECTOMY     . HX WISDOM TEETH EXTRACTION     . MOLE REMOVAL      several all normal     Medications:  Current Outpatient Medications   Medication Sig   . ASPIRIN (ASPIR-81 ORAL) Once a day   . Blood Sugar Diagnostic (ASCENSIA CONTOUR) Strip 1 Strip by Does not apply route Once a day   . Blood Sugar Diagnostic (ASCENSIA CONTOUR) Strip Test daily   . busPIRone (BUSPAR) 5 mg Oral Tablet Take 1 Tab (5 mg total) by mouth Three times a day as needed (anxiety)   . escitalopram oxalate (LEXAPRO) 20 mg Oral Tablet take 1 tablet by mouth once daily   . famotidine (PEPCID) 20 mg Oral Tablet Take 20 mg by mouth Twice daily   . GLUCOSAM/GLUC SU/AC-ALP-D-GLUC (GLUCOSAMINE COMPLEX ORAL) Once a day   . lisinopriL (PRINIVIL) 20 mg Oral Tablet Take 1 tablet by mouth once daily   . metFORMIN (GLUCOPHAGE) 1,000 mg Oral Tablet Take 1 Tab (1,000 mg total) by mouth Twice daily with food   . MULTIVITAMIN (MULTIPLE VITAMINS ORAL) Once a day   . simvastatin (ZOCOR) 40 mg Oral Tablet take 1 tablet by mouth once daily   . vitamin E 100 unit Oral Capsule Take 100 Units by mouth Once a day     Allergies:  No Known Allergies     Family history:  Family Medical History:     Problem Relation (Age of Onset)    Breast Cancer Mother    Diabetes Mother, Maternal Grandfather    High Cholesterol Mother, Brother    Hypertension (High Blood Pressure) Mother, Father, Brother    Liver Cancer Father    Lymphoma Maternal Aunt    Melanoma Maternal Aunt    No Known Problems Sister, Maternal Grandmother, Paternal Grandmother, Paternal 79, Daughter, Son, Maternal Uncle, Paternal 20, Paternal Uncle, Other        Social history:  Social History     Socioeconomic History   . Marital status: Married     Spouse name: Not on file   . Number of children: Not on file   . Years of education: Not on file   . Highest education level:  Not on file   Occupational History   . Occupation: math Buyer, retail   Tobacco Use   . Smoking status: Never Smoker   . Smokeless tobacco: Never Used   Substance and Sexual Activity   . Alcohol use: Yes     Comment: 1 or 2 drinks per week       Vitals:    03/16/19 0816 03/16/19 0906   BP: (!) 146/94 (!) 128/98   Pulse: 89    Temp: 36.6 C (97.9 F)    TempSrc: Tympanic    SpO2: 98%    Weight: 99.8 kg (220 lb)    Height: 1.575 m (5\' 2" )    BMI: 40.32      Physical Examination:    GENERAL: Pt is a pleasant, well-nourished, well-developed 58 y.o. female who is in NAD. Appears stated age  52:  head normocephalic, symmetrical facies. EOM intact b/l. PERRLA. Sclera non-icteric, non-injected. No rhinorrhea. Oropharyngeal mucous membranes are moist  w/o erythema/exudates   NECK: supple. No masses, lymphadenopathy, JVD on exam. Trachea midline, no thyromegaly   CV: S1, S2.  No murmurs, rubs, gallops.     LUNGS: CTAB, No rhonchi, rales, wheezes.   GI: (+) BS in all 4 quadrants. soft, NT/ND. No rigidity/ guarding/ rebound. No organomegaly/masses, or abdominal bruits  GU: deferred  MSK: Spontaneous normal AROM of major joints as observed during exam. No joint effusions/ swelling/ deformities.   No BLE edema, clubbing, or cyanosis.   2+ radial pulse present b/l.   + 2 reflexes Brachioradialis and patellar bilaterally.   Gait normal.  SKIN: No significant lesions, rashes, ecchymoses noted.   NEURO: AAOx4, CN grossly intact. Sensation intact b/l. No focal deficits.  PSYCH: Mood, behavior and affect normal w/ Intact judgement & insight     Diabetes Monitors  A1C: 8  A1C Date: 03/16/2019           Nephropathy Screening: On ACEI or ARB    Last Lipid Panel  (Last result in the past 2 years)      Cholesterol   HDL   LDL   Direct LDL   Triglycerides      03/16/19 0923 173  Comment:  Total Cholesterol Classification:     <200        Desirable        200-239     Borderline High        > or = 240  High 50  Comment:  HDL Cholesterol  Classification:          <40        Low        > or = 60  High   91  Comment:  LDL Cholesterol Classification:         <100     Optimal       100-129  Near or Above Optimal       130-159  Borderline High       160-189  High       >190     Very High   159  Comment:  Triglyceride Classification:            <150    Normal       150-199 Borderline-High       200-499 High       >500    Very High        Retinal Exam Date: 05/29/2018  Last diabetic foot exam: 08/05/2017    Visit Diagnosis    Encounter to establish care with new doctor  Transfer from Dr. Anne Fu.   Lives in [place] with her husband. [employer] Music therapist for [employer].   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 Family friends of Dr. Russ Halo.     Type 2 diabetes mellitus without complication, without long-term current use of insulin (CMS HCC)  Chronic, uncontrolled. HbA1C increased from 6.9% to 8.0% today.   Patient admits to poor compliance with diabetic diet. Encouraged decrease carbohydrate and sugar intake. Encouraged increased activity for weight loss.   Check blood sugars only as needed and for reference for her dietary changes.   Increase metformin from 500 mg BID to 1000 mg BID. Discussed that this increases risk of potential side effects and encouraged her to reach out if this becomes an issue.   Continue lisinopril 20 mg daily.   Continue simvastatin 40 mg daily.   Will need foot exam.   Patient follows with ophthalmologist annually.     Essential hypertension  Chronic, uncontrolled. Encouraged lifestyle modifications.   Continue behavioral efforts, esp  Cardiac  friendly diet,   < 2 gm salt q day.   Exercise 20 min q day most days of the week.  Continue ACEI - lisinopril 20 mg daily.   Labs ordered.     Anxiety and depression  Chronic, controlled with medication.   No SI/HI thoughts voiced at this time.   Continue Lexapro 20 mg daily.   PHQ next encounter.   Restart BuSpar 5 mg TID PRN for breakthrough anxiety.     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   Urine dipped in office - small leukocytes. Culture sent. Will treat based on culture.   Patient is UTD on screening Pap, however, was invited back to the office in 2-3 weeks for pelvic exam if symptoms persist.     Screening for HIV (human immunodeficiency virus)  Encounter for hepatitis C virus screening test for high risk patient  Discussed HIV and HCV screening with patient in office.   Ordered with labs.     Screening for breast cancer  Mammogram ordered.     Orders Placed This Encounter   . URINE CULTURE   . MAMMO BILATERAL SCREENING-ADDL VIEWS/BREAST US AS REQ BY RAD   . CBC/DIFF   . COMPREHENSIVE METABOLIC PNL, FASTING   . LIPID PANEL   . MICROALBUMIN/CREATININE RATIO, URINE, RANDOM   . HEPATITIS C ANTIBODY   . HIV1/HIV2 SCREEN, COMBINED ANTIGEN AND ANTIBODY   . URINALYSIS, MACROSCOPIC   . CANCELED: POCT URINE DIPSTICK   . POCT HGB A1C   . POCT URINE DIPSTICK   . busPIRone (BUSPAR) 5 mg Oral Tablet   . metFORMIN (GLUCOPHAGE) 1,000 mg Oral Tablet     Return in about 3 months (around 06/15/2019), or if symptoms worsen or fail to improve, for DMII, HTN.    Chryl Heck, PA-C  03/16/2019, 11:04  Electronically signed by Chryl Heck, PA-C

## 2019-03-16 NOTE — Nursing Note (Signed)
03/16/19 0800   Urine test  (Siemens Multistix 10 SG)   Time collected 0836   Color Yellow   Clarity Cloudy   Glucose Negative   Bilirubin Negative   Ketones Negative   Urine Specific Gravity 1.015   Blood (urine) Negative   pH 5.5   Protein Negative   Urobilinogen 0.2mg /dL (Normal)   Nitrite Negative   Leukocytes (!) 1+   Initials ABR, LPN   [id number]   Time Performed 0838   A1C 8.0   Initials ABR, LPN

## 2019-03-18 LAB — URINE CULTURE,ROUTINE: URINE CULTURE: 50000

## 2019-03-19 ENCOUNTER — Other Ambulatory Visit (HOSPITAL_BASED_OUTPATIENT_CLINIC_OR_DEPARTMENT_OTHER): Payer: Self-pay | Admitting: PHYSICIAN ASSISTANT

## 2019-03-19 MED ORDER — LISINOPRIL 20 MG TABLET
20.0000 mg | ORAL_TABLET | Freq: Every day | ORAL | 1 refills | Status: DC
Start: 2019-03-19 — End: 2019-08-16

## 2019-03-19 NOTE — Telephone Encounter (Signed)
Regarding: Andrea Huerta  ----- Message from Laurance Flatten sent at 03/19/2019 10:44 AM EDT -----  Med refill. Also would like [facility] taken off of pref pharm list    #lisinopriL (PRINIVIL) 20 mg Oral Tablet, Take 1 tablet by mouth once daily    Preferred Pharmacy     [id number] [address] [place]    [address] [place] [address]    Phone: [phone] Fax: [phone]    Not a 24 hour pharmacy; exact hours not known.

## 2019-03-19 NOTE — Telephone Encounter (Signed)
Please see result note.   No infection, only normal skin bacteria grew.   We discussed this possibility (contaminant) during her appointment.   Let me know if she has any other questions.     Chryl Heck, PA-C  03/19/2019, 08:28

## 2019-03-19 NOTE — Progress Notes (Signed)
My Chart message sent

## 2019-03-19 NOTE — Telephone Encounter (Signed)
Spoke with patient about her concerns, voiced understanding.     Hal Neer, LPN

## 2019-04-03 ENCOUNTER — Other Ambulatory Visit (HOSPITAL_BASED_OUTPATIENT_CLINIC_OR_DEPARTMENT_OTHER): Payer: Self-pay | Admitting: PHYSICIAN ASSISTANT

## 2019-04-03 DIAGNOSIS — I10 Essential (primary) hypertension: Secondary | ICD-10-CM

## 2019-04-03 DIAGNOSIS — F419 Anxiety disorder, unspecified: Secondary | ICD-10-CM

## 2019-04-05 ENCOUNTER — Other Ambulatory Visit (HOSPITAL_BASED_OUTPATIENT_CLINIC_OR_DEPARTMENT_OTHER): Payer: Self-pay | Admitting: PHYSICIAN ASSISTANT

## 2019-04-05 DIAGNOSIS — F419 Anxiety disorder, unspecified: Secondary | ICD-10-CM

## 2019-04-05 DIAGNOSIS — I10 Essential (primary) hypertension: Secondary | ICD-10-CM

## 2019-04-05 NOTE — Telephone Encounter (Signed)
Regarding: Dr. Russ Halo   ----- Message from Lacie Draft sent at 04/05/2019 11:22 AM EDT -----  escitalopram oxalate (LEXAPRO) 20 mg Oral Tablet, take 1 tablet by mouth once daily  simvastatin (ZOCOR) 40 mg Oral Tablet, take 1 tablet by mouth once daily  [facility]eds [facility] [address] patient is switching pharmacy

## 2019-04-06 MED ORDER — SIMVASTATIN 40 MG TABLET
ORAL_TABLET | ORAL | 2 refills | Status: DC
Start: 2019-04-06 — End: 2019-12-05

## 2019-04-06 MED ORDER — ESCITALOPRAM 20 MG TABLET
ORAL_TABLET | ORAL | 2 refills | Status: DC
Start: 2019-04-06 — End: 2019-12-05

## 2019-04-24 ENCOUNTER — Encounter (HOSPITAL_BASED_OUTPATIENT_CLINIC_OR_DEPARTMENT_OTHER): Payer: Self-pay

## 2019-04-24 ENCOUNTER — Ambulatory Visit
Admission: RE | Admit: 2019-04-24 | Discharge: 2019-04-24 | Disposition: A | Discharge status: Home / Self Care | Payer: 59 | Source: Ambulatory Visit | Attending: PHYSICIAN ASSISTANT | Admitting: PHYSICIAN ASSISTANT

## 2019-04-24 ENCOUNTER — Other Ambulatory Visit: Payer: Self-pay

## 2019-04-24 DIAGNOSIS — Z1239 Encounter for other screening for malignant neoplasm of breast: Secondary | ICD-10-CM

## 2019-04-24 DIAGNOSIS — Z1231 Encounter for screening mammogram for malignant neoplasm of breast: Secondary | ICD-10-CM | POA: Insufficient documentation

## 2019-04-27 ENCOUNTER — Other Ambulatory Visit (HOSPITAL_BASED_OUTPATIENT_CLINIC_OR_DEPARTMENT_OTHER): Payer: Self-pay | Admitting: Family Medicine

## 2019-04-27 DIAGNOSIS — R928 Other abnormal and inconclusive findings on diagnostic imaging of breast: Secondary | ICD-10-CM

## 2019-04-27 NOTE — Progress Notes (Signed)
Please inform the pt that her mammogram has shown a less than 1 cm lesion of the left breast.  There is no way of characterizing the lesion farther. She will need an ultrasound to understand it better. Offer reassurance. I've placed an order for Ultrasound. We will review this at our next meeting. [place]

## 2019-05-07 ENCOUNTER — Ambulatory Visit
Admission: RE | Admit: 2019-05-07 | Discharge: 2019-05-07 | Disposition: A | Discharge status: Home / Self Care | Payer: 59 | Source: Ambulatory Visit | Attending: Family Medicine | Admitting: Family Medicine

## 2019-05-07 ENCOUNTER — Other Ambulatory Visit: Payer: Self-pay

## 2019-05-07 DIAGNOSIS — R928 Other abnormal and inconclusive findings on diagnostic imaging of breast: Secondary | ICD-10-CM | POA: Insufficient documentation

## 2019-05-07 NOTE — Progress Notes (Signed)
Please inform the patient that the ultrasound of her left breast showed no obvious pathology.  Radiologist recommended repeat mammogram in 1 year.    Will review this at our next encounter.  TY TH

## 2019-05-07 NOTE — Progress Notes (Signed)
Called and notified pt of results, pt verbally understood.    Traevon Meiring, LPN

## 2019-05-08 ENCOUNTER — Ambulatory Visit (HOSPITAL_BASED_OUTPATIENT_CLINIC_OR_DEPARTMENT_OTHER): Payer: Self-pay | Admitting: Family Medicine

## 2019-06-01 ENCOUNTER — Encounter (HOSPITAL_BASED_OUTPATIENT_CLINIC_OR_DEPARTMENT_OTHER): Payer: Self-pay | Admitting: Family Medicine

## 2019-06-19 ENCOUNTER — Encounter (HOSPITAL_BASED_OUTPATIENT_CLINIC_OR_DEPARTMENT_OTHER): Payer: Self-pay | Admitting: Family Medicine

## 2019-08-08 ENCOUNTER — Encounter (HOSPITAL_BASED_OUTPATIENT_CLINIC_OR_DEPARTMENT_OTHER): Payer: 59 | Admitting: Family Medicine

## 2019-08-08 ENCOUNTER — Telehealth: Payer: 59 | Admitting: Family Medicine

## 2019-08-08 ENCOUNTER — Encounter (HOSPITAL_BASED_OUTPATIENT_CLINIC_OR_DEPARTMENT_OTHER): Payer: Self-pay | Admitting: Family Medicine

## 2019-08-08 ENCOUNTER — Other Ambulatory Visit (HOSPITAL_BASED_OUTPATIENT_CLINIC_OR_DEPARTMENT_OTHER): Payer: Self-pay | Admitting: Family Medicine

## 2019-08-08 ENCOUNTER — Other Ambulatory Visit: Payer: Self-pay

## 2019-08-08 DIAGNOSIS — I10 Essential (primary) hypertension: Secondary | ICD-10-CM

## 2019-08-08 DIAGNOSIS — F418 Other specified anxiety disorders: Secondary | ICD-10-CM

## 2019-08-08 DIAGNOSIS — E119 Type 2 diabetes mellitus without complications: Secondary | ICD-10-CM

## 2019-08-08 MED ORDER — METFORMIN ER 1,000 MG TABLET,EXTENDED RELEASE 24HR (OSMOTIC)
1000.0000 mg | EXTENDED_RELEASE_TABLET | Freq: Every day | ORAL | 4 refills | Status: DC
Start: 2019-08-08 — End: 2020-03-12

## 2019-08-08 MED ORDER — METFORMIN 500 MG TABLET
500.00 mg | ORAL_TABLET | Freq: Two times a day (BID) | ORAL | 4 refills | Status: DC
Start: 2019-08-08 — End: 2020-03-12

## 2019-08-08 NOTE — Progress Notes (Signed)
FAMILY MEDICINE [place] [address] [place]  [place]  [place] [address]      Virtual Check-in     This visit was completed during the national Covid-19 emergency to provide patient initiated care by remote technology.     Name:  Andrea Huerta MRN: [id number]   Date:   08/08/2019 Age: 59 y.o.     The patient/family initiated a request for virtual check-in and is established with this practice.  Verbal consent for this service was obtained from the patient/family.    Last office visit in this department: 03/16/2019.      Reason for call: Diabetes Follow up.    History:    The patient is a 59 y.o. old female who initiated a telemedicine visit to discuss:    Diabetes: Last HbA1C was  8%.   Patient denies symptoms of polydipsia, polyuria, hypoglycemic unawareness,  chest pain, dyspnea on exertion, diarrhea, foot ulcerations, paresthesias. Symptoms are not changed. Pt states she checks BS at times, but not daily. Patient's home blood sugars are controlled.  Pt takes glucophage as ordered. Patient is compliant with medications.Has GI upset with meds. Has reduced AM to 500 mg dose. Would like to consider med chnages.     Physical Exam:     Pt sounds healthy on the phone.       Diabetes Monitors  A1C: 8  A1C Date: 03/16/2019           Nephropathy Screening: On ACEI or ARB    Last Lipid Panel  (Last result in the past 2 years)      Cholesterol   HDL   LDL   Direct LDL   Triglycerides      03/16/19 0923 173  Comment:  Total Cholesterol Classification:     <200        Desirable        200-239     Borderline High        > or = 240  High 50  Comment:  HDL Cholesterol Classification:          <40        Low        > or = 60  High   91  Comment:  LDL Cholesterol Classification:         <100     Optimal       100-129  Near or Above Optimal       130-159  Borderline High       160-189  High       >190     Very High   159  Comment:  Triglyceride Classification:            <150    Normal       150-199 Borderline-High        200-499 High       >500    Very High        Retinal Exam Date: 05/29/2018  Last diabetic foot exam: Not Found      Assessment/Plan:    1. Type 2 diabetes mellitus without complication, without long-term current use of insulin (CMS HCC)         Orders Placed This Encounter   . MICROALBUMIN URINE, RANDOM   . LIPID PANEL   . HGA1C (HEMOGLOBIN A1C WITH EST AVG GLUCOSE)   . Metformin (FORTAMET) 1,000 mg Oral Tablet Extended Rel 24 hr (2)   . metFORMIN (GLUCOPHAGE) 500 mg Oral Tablet  Type 2 diabetes mellitus without complication, without long-term current use of insulin (CMS HCC)  Chronic, uncontrolled.   HbA1C 8.0% last visit    Patient admits to poor compliance with diabetic diet.   Encouraged decrease carbohydrate and sugar intake.  Encouraged increased activity for weight loss.   Check blood sugars only as needed and for reference for her dietary changes.   continue  metformin  1000 mg BID.   Discussed that this increases risk of potential side effects and encouraged her to reach out if this becomes an issue.   Continue lisinopril 20 mg daily.   Continue simvastatin 40 mg daily.   Will need foot exam.   Patient follows with ophthalmologist annually.     Essential hypertension  Chronic, uncontrolled. Encouraged lifestyle modifications.   Continue behavioral efforts, esp  Cardiac friendly diet,   < 2 gm salt q day.   Exercise 20 min q day most days of the week.  Continue ACEI - lisinopril 20 mg daily.   Labs ordered.     Anxiety and depression  Chronic, controlled with medication.   No SI/HI thoughts voiced at this time.   Continue Lexapro 20 mg daily.   PHQ next encounter.   Restart BuSpar 5 mg TID PRN for breakthrough anxiety.     Total provider time spent with the patient on the phone: 16 minutes.    Return in about 3 months (around 11/05/2019), or if symptoms worsen or fail to improve, for DM.    Cherly Beach, MD  08/08/2019, 11:09    This note was partially generated using MModal Fluency Direct system, and  there may be some incorrect words, spellings, and punctuation that were not noted in checking the note before saving.

## 2019-08-16 ENCOUNTER — Other Ambulatory Visit (HOSPITAL_BASED_OUTPATIENT_CLINIC_OR_DEPARTMENT_OTHER): Payer: Self-pay | Admitting: Family Medicine

## 2019-08-16 DIAGNOSIS — I10 Essential (primary) hypertension: Secondary | ICD-10-CM

## 2019-08-16 MED ORDER — LISINOPRIL 20 MG TABLET
20.0000 mg | ORAL_TABLET | Freq: Every day | ORAL | 1 refills | Status: DC
Start: 2019-08-16 — End: 2020-03-10

## 2019-08-20 ENCOUNTER — Other Ambulatory Visit (HOSPITAL_BASED_OUTPATIENT_CLINIC_OR_DEPARTMENT_OTHER): Payer: Self-pay | Admitting: Family Medicine

## 2019-08-20 DIAGNOSIS — E119 Type 2 diabetes mellitus without complications: Secondary | ICD-10-CM

## 2019-08-20 NOTE — Telephone Encounter (Signed)
Metformin changed from 1000mg  ER to 500 mg ER 2 tabs qday per pharmacy request. Pt's insurance will not cover the 1000mg  ER.   Ranae Pila, RN

## 2019-08-21 MED ORDER — METFORMIN ER 500 MG TABLET,EXTENDED RELEASE 24HR (OSMOTIC)
EXTENDED_RELEASE_TABLET | ORAL | 2 refills | Status: DC
Start: 2019-08-21 — End: 2020-03-12

## 2019-08-31 ENCOUNTER — Ambulatory Visit (HOSPITAL_BASED_OUTPATIENT_CLINIC_OR_DEPARTMENT_OTHER): Payer: Self-pay | Admitting: Family Medicine

## 2019-08-31 ENCOUNTER — Other Ambulatory Visit (HOSPITAL_BASED_OUTPATIENT_CLINIC_OR_DEPARTMENT_OTHER): Payer: Self-pay | Admitting: Family Medicine

## 2019-08-31 DIAGNOSIS — E119 Type 2 diabetes mellitus without complications: Secondary | ICD-10-CM

## 2019-08-31 MED ORDER — METFORMIN ER 500 MG TABLET,EXTENDED RELEASE 24 HR
ORAL_TABLET | ORAL | 3 refills | Status: DC
Start: 2019-08-31 — End: 2020-08-12

## 2019-08-31 NOTE — Telephone Encounter (Signed)
Pt called stating that her insurance is not covering the [place] she was prescribed and states that the regular Metformin causes her GI upset. PA sent in today to pt's insurance for [place] 1000mg  qday.   Ranae Pila, RN

## 2019-09-14 ENCOUNTER — Other Ambulatory Visit (HOSPITAL_BASED_OUTPATIENT_CLINIC_OR_DEPARTMENT_OTHER): Payer: Self-pay | Admitting: PHYSICIAN ASSISTANT

## 2019-09-14 DIAGNOSIS — E119 Type 2 diabetes mellitus without complications: Secondary | ICD-10-CM

## 2019-11-15 ENCOUNTER — Encounter (HOSPITAL_BASED_OUTPATIENT_CLINIC_OR_DEPARTMENT_OTHER): Payer: Self-pay | Admitting: Family Medicine

## 2019-12-05 ENCOUNTER — Other Ambulatory Visit (HOSPITAL_BASED_OUTPATIENT_CLINIC_OR_DEPARTMENT_OTHER): Payer: Self-pay | Admitting: Family Medicine

## 2019-12-05 DIAGNOSIS — F419 Anxiety disorder, unspecified: Secondary | ICD-10-CM

## 2019-12-05 DIAGNOSIS — I10 Essential (primary) hypertension: Secondary | ICD-10-CM

## 2019-12-31 ENCOUNTER — Encounter (HOSPITAL_BASED_OUTPATIENT_CLINIC_OR_DEPARTMENT_OTHER): Payer: Self-pay | Admitting: Family Medicine

## 2020-02-12 ENCOUNTER — Other Ambulatory Visit: Payer: Self-pay

## 2020-02-12 ENCOUNTER — Encounter (HOSPITAL_BASED_OUTPATIENT_CLINIC_OR_DEPARTMENT_OTHER): Payer: Self-pay

## 2020-02-12 ENCOUNTER — Ambulatory Visit (INDEPENDENT_AMBULATORY_CARE_PROVIDER_SITE_OTHER): Payer: 59

## 2020-02-12 DIAGNOSIS — L259 Unspecified contact dermatitis, unspecified cause: Secondary | ICD-10-CM

## 2020-02-12 NOTE — Nursing Note (Signed)
02/12/20 1500   Medication Administration   Initials CG   Medication  Solu-Medrol   Medication Concentration 125mg /81mL   Medication Dose Given 125mg    Medication Volume Given (mL) 84ml   Route of Administration IM   Site Left Gluteus   [facility] # [id number]   LOT # [id number]   Expiration date 12/18/20   Manufacturer [place] Yes   Patient Supplied No

## 2020-02-13 MED ORDER — METHYLPREDNISOLONE SOD SUCC 125 MG SOLUTION FOR INJECTION WRAPPER
125.00 mg | INTRAVENOUS | 0 refills | Status: DC
Start: 2020-02-13 — End: 2020-03-12

## 2020-02-13 NOTE — Progress Notes (Deleted)
[place]  [place]  Andrea Huerta [address]  [phone]    02/13/2020    Andrea Huerta  25-Feb-1961    Chief Complaint   Patient presents with   . Contact Dermatitis       HPI:  This is a 59 y.o. year old female who comes in today for Contact Dermatitis            Past Medical History  Past Medical History:   Diagnosis Date   . Diabetes mellitus, type 2 (CMS HCC)    . H/O complete eye exam 01/2016   . H/O mammogram 2017   . High cholesterol    . History of dental examination 04/2016   . Hypertension    . Sleep apnea    . Vaginal Pap smear 10/2014         Past Surgical History:   Procedure Laterality Date   . HX CESAREAN SECTION     . HX TONSILLECTOMY     . HX WISDOM TEETH EXTRACTION     . MOLE REMOVAL      several all normal         Current Outpatient Medications   Medication Sig   . ASPIRIN (ASPIR-81 ORAL) Once a day   . Blood Sugar Diagnostic (ASCENSIA CONTOUR) Strip 1 Strip by Does not apply route Once a day   . Blood Sugar Diagnostic (ASCENSIA CONTOUR) Strip Test daily   . busPIRone (BUSPAR) 5 mg Oral Tablet Take 1 Tab (5 mg total) by mouth Three times a day as needed (anxiety)   . escitalopram oxalate (LEXAPRO) 20 mg Oral Tablet TAKE 1 TABLET BY MOUTH EVERY DAY   . famotidine (PEPCID) 20 mg Oral Tablet Take 20 mg by mouth Twice daily   . GLUCOSAM/GLUC SU/AC-ALP-D-GLUC (GLUCOSAMINE COMPLEX ORAL) Once a day   . lisinopriL (PRINIVIL) 20 mg Oral Tablet Take 1 Tablet (20 mg total) by mouth Once a day Take 1 tablet by mouth once daily   . Metformin (FORTAMET) 1,000 mg Oral Tablet Extended Rel 24 hr (2) Take 1 Tablet (1,000 mg total) by mouth Once a day   . Metformin (FORTAMET) 500 mg Oral Tablet Extended Rel 24 hr (2) Take 2 tabs by mouth daily.   . metFORMIN (GLUCOPHAGE XR) 500 mg Oral Tablet Sustained Release 24 hr Take 2 tabs by mouth daily.   . MetFORMIN (GLUCOPHAGE) 1,000 mg Oral Tablet TAKE 1 TABLET BY MOUTH TWICE A DAY WITH FOOD   . metFORMIN (GLUCOPHAGE) 500 mg Oral Tablet  Take 1 Tablet (500 mg total) by mouth Twice daily with food   . MULTIVITAMIN (MULTIPLE VITAMINS ORAL) Once a day   . simvastatin (ZOCOR) 40 mg Oral Tablet TAKE 1 TABLET BY MOUTH EVERY DAY   . vitamin E 100 unit Oral Capsule Take 100 Units by mouth Once a day     No Known Allergies  Family Medical History:     Problem Relation (Age of Onset)    Breast Cancer Mother    Diabetes Mother, Maternal Grandfather    High Cholesterol Mother, Brother    Hypertension (High Blood Pressure) Mother, Father, Brother    Liver Cancer Father    Lymphoma Maternal Aunt    Melanoma Maternal Aunt    No Known Problems Sister, Maternal Grandmother, Paternal Grandmother, Paternal 15, Daughter, Son, Maternal Uncle, Paternal 39, Paternal Uncle, Other            Social History  Socioeconomic History   . Marital status: Married     Spouse name: Not on file   . Number of children: Not on file   . Years of education: Not on file   . Highest education level: Not on file   Occupational History   . Occupation: math Buyer, retail   Tobacco Use   . Smoking status: Never Smoker   . Smokeless tobacco: Never Used   Substance and Sexual Activity   . Alcohol use: Yes     Comment: 1 or 2 drinks per week     Social Determinants of Health     Financial Resource Strain:    . Difficulty of Paying Living Expenses:    Food Insecurity:    . Worried About Charity fundraiser in the Last Year:    . Arboriculturist in the Last Year:    Transportation Needs:    . Film/video editor (Medical):    Marland Kitchen Lack of Transportation (Non-Medical):    Physical Activity:    . Days of Exercise per Week:    . Minutes of Exercise per Session:    Stress:    . Feeling of Stress :    Intimate Partner Violence:    . Fear of Current or Ex-Partner:    . Emotionally Abused:    Marland Kitchen Physically Abused:    . Sexually Abused:      ROS    LMP  (LMP Unknown)         Physical Exam    Assessment:  (L25.9) Contact dermatitis, unspecified contact dermatitis type, unspecified trigger   (primary encounter diagnosis)  Plan: methylPREDNISolone sodium succ (SOLU-MEDROL)         125 mg/2 mL Solution      Plan:  No orders of the defined types were placed in this encounter.      No visits with results within 6 Month(s) from this visit.   Latest known visit with results is:   Appointment on 03/16/2019   Component Date Value Ref Range Status   . CREATININE RANDOM URINE 03/16/2019 42* 80 - 180 mg/dL Final   . MICROALBUMIN RANDOM URINE 03/16/2019 <0.6  No Reference Range Established mg/dL Final   . MICROALBUMIN/CREATININE RATIO RAND* 03/16/2019    Final    Unable to calculate due to low mAlb   . COLOR 03/16/2019 Yellow  Colorless, Straw, Yellow Final   . APPEARANCE 03/16/2019 Clear  Clear Final   . SPECIFIC GRAVITY 03/16/2019 1.008  >1.005-<1.030 Final   . PH 03/16/2019 6.0  >5.0-<8.0 Final   . PROTEIN 03/16/2019 Not Detected  Not Detected mg/dL Final   . GLUCOSE 03/16/2019 Not Detected  Not Detected mg/dL Final   . KETONES 03/16/2019 Not Detected  Not Detected mg/dL Final   . UROBILINOGEN 03/16/2019 Not Detected  Not Detected mg/dL Final   . BILIRUBIN 03/16/2019 Not Detected  Not Detected mg/dL Final   . BLOOD 03/16/2019 Not Detected  Not Detected mg/dL Final   . NITRITE 03/16/2019 Not Detected  Not Detected Final   . LEUKOCYTES 03/16/2019 2+* Not Detected WBCs/uL Final   . WBCS 03/16/2019 6-10* 0-5, None /hpf Final   . RBCS 03/16/2019 0-5  None, 0-5 /hpf Final   . SQUAMOUS EPITHELIAL 03/16/2019 Rare* None /hpf Final   . TRANSITIONAL EPITHELIAL 03/16/2019 Rare* None /hpf Final   . URINE CULTURE 03/16/2019 50000 Normal Commensal [place]   Final    No Pathogens Isolated  No results were found from the past 30 days.    No follow-ups on file.      Current Outpatient Medications   Medication Sig   . ASPIRIN (ASPIR-81 ORAL) Once a day   . Blood Sugar Diagnostic (ASCENSIA CONTOUR) Strip 1 Strip by Does not apply route Once a day   . Blood Sugar Diagnostic (ASCENSIA CONTOUR) Strip Test daily   . busPIRone  (BUSPAR) 5 mg Oral Tablet Take 1 Tab (5 mg total) by mouth Three times a day as needed (anxiety)   . escitalopram oxalate (LEXAPRO) 20 mg Oral Tablet TAKE 1 TABLET BY MOUTH EVERY DAY   . famotidine (PEPCID) 20 mg Oral Tablet Take 20 mg by mouth Twice daily   . GLUCOSAM/GLUC SU/AC-ALP-D-GLUC (GLUCOSAMINE COMPLEX ORAL) Once a day   . lisinopriL (PRINIVIL) 20 mg Oral Tablet Take 1 Tablet (20 mg total) by mouth Once a day Take 1 tablet by mouth once daily   . Metformin (FORTAMET) 1,000 mg Oral Tablet Extended Rel 24 hr (2) Take 1 Tablet (1,000 mg total) by mouth Once a day   . Metformin (FORTAMET) 500 mg Oral Tablet Extended Rel 24 hr (2) Take 2 tabs by mouth daily.   . metFORMIN (GLUCOPHAGE XR) 500 mg Oral Tablet Sustained Release 24 hr Take 2 tabs by mouth daily.   . MetFORMIN (GLUCOPHAGE) 1,000 mg Oral Tablet TAKE 1 TABLET BY MOUTH TWICE A DAY WITH FOOD   . metFORMIN (GLUCOPHAGE) 500 mg Oral Tablet Take 1 Tablet (500 mg total) by mouth Twice daily with food   . MULTIVITAMIN (MULTIPLE VITAMINS ORAL) Once a day   . simvastatin (ZOCOR) 40 mg Oral Tablet TAKE 1 TABLET BY MOUTH EVERY DAY   . vitamin E 100 unit Oral Capsule Take 100 Units by mouth Once a day     Last dept visit:  03/16/2019  Next pending dept visit: 03/12/2020    Amado Coe, MD 02/13/2020, 08:01          Nursing Notes:   Norris Cross, LPN  70/35/00 [id number]  Signed     02/12/20 1500   Medication Administration   Initials CG   Medication  Solu-Medrol   Medication Concentration 125mg /42mL   Medication Dose Given 125mg    Medication Volume Given (mL) 73ml   Route of Administration IM   Site Left Gluteus   [facility] # [id number]   LOT # [id number]   Expiration date 12/18/20   Manufacturer [place] Yes   Patient Supplied No               Education  {EDUCATION JI:96789381}  Depression screening is positive. Follow up plan of care: {Depression follow up plan:25554}    {Tobacco cessation counseling:21019580}    Tobacco Cessation Counseling Performed.   Patient has tried *** in the past.  Patient is willing to try additional means to quit smoking.  Elevated Blood Pressure Plan of Care:  {Blood pressure follow up plan:25553}             Amado Coe, MD 02/13/2020, 08:01    The patient's caregiver or patient was counseled on any appropriate vaccinations by the provider and questions were answered if vaccines were given today.  The patient was given the opportunity to ask questions and those questions were answered to the patient's satisfaction. The patient was encouraged to call with any additional questions or concerns.   Discussed with patient effects and side effects of medications. Medication safety was  discussed. A copy of the patient's medication list was printed and given to the patient.   The chart has been reviewed in its entirety and I concur with the history and review of systems.  Instructed to please continue all other medications as prescribed previously.    This note may have been partially generated using MModal Fluency Direct system, and there may be some incorrect words, spellings, and punctuation that were not noted in checking the note before saving.    Amado Coe, MD

## 2020-03-09 ENCOUNTER — Other Ambulatory Visit (HOSPITAL_BASED_OUTPATIENT_CLINIC_OR_DEPARTMENT_OTHER): Payer: Self-pay | Admitting: Family Medicine

## 2020-03-09 DIAGNOSIS — I10 Essential (primary) hypertension: Secondary | ICD-10-CM

## 2020-03-10 NOTE — Telephone Encounter (Signed)
Last visit 08/08/19  Next visit 03/12/20

## 2020-03-12 ENCOUNTER — Other Ambulatory Visit: Payer: Self-pay

## 2020-03-12 ENCOUNTER — Ambulatory Visit (HOSPITAL_BASED_OUTPATIENT_CLINIC_OR_DEPARTMENT_OTHER): Payer: 59 | Admitting: Family Medicine

## 2020-03-12 ENCOUNTER — Encounter (HOSPITAL_BASED_OUTPATIENT_CLINIC_OR_DEPARTMENT_OTHER): Payer: Self-pay | Admitting: Family Medicine

## 2020-03-12 VITALS — BP 140/94 | HR 90 | Temp 97.5°F | Resp 16 | Ht 62.0 in | Wt 222.0 lb

## 2020-03-12 DIAGNOSIS — K219 Gastro-esophageal reflux disease without esophagitis: Secondary | ICD-10-CM

## 2020-03-12 DIAGNOSIS — F419 Anxiety disorder, unspecified: Secondary | ICD-10-CM

## 2020-03-12 DIAGNOSIS — E119 Type 2 diabetes mellitus without complications: Secondary | ICD-10-CM

## 2020-03-12 DIAGNOSIS — Z23 Encounter for immunization: Secondary | ICD-10-CM

## 2020-03-12 DIAGNOSIS — N898 Other specified noninflammatory disorders of vagina: Secondary | ICD-10-CM

## 2020-03-12 DIAGNOSIS — I10 Essential (primary) hypertension: Secondary | ICD-10-CM

## 2020-03-12 DIAGNOSIS — F329 Major depressive disorder, single episode, unspecified: Secondary | ICD-10-CM

## 2020-03-12 LAB — POCT HGB A1C: POCT HGB A1C: 9.7 % — AB (ref 4–6)

## 2020-03-12 MED ORDER — METFORMIN 500 MG TABLET
500.0000 mg | ORAL_TABLET | Freq: Three times a day (TID) | ORAL | 3 refills | Status: DC
Start: 2020-03-12 — End: 2020-09-12

## 2020-03-12 MED ORDER — PANTOPRAZOLE 40 MG TABLET,DELAYED RELEASE
40.0000 mg | DELAYED_RELEASE_TABLET | Freq: Every day | ORAL | 2 refills | Status: DC
Start: 2020-03-12 — End: 2020-12-01

## 2020-03-12 MED ORDER — FREESTYLE LIBRE 14 DAY READER
0 refills | Status: AC
Start: 2020-03-12 — End: ?

## 2020-03-12 MED ORDER — FREESTYLE LIBRE 14 DAY SENSOR KIT
PACK | 2 refills | Status: AC
Start: 2020-03-12 — End: ?

## 2020-03-12 NOTE — Nursing Note (Signed)
03/12/20 1600   A1C   Time Performed 1601   A1C 9.7   References Ranges 4 - 6%

## 2020-03-12 NOTE — Progress Notes (Signed)
[place], [place]  [place]  [place] [address]  [phone]    Patient Name: Andrea Huerta  Date: 03/12/2020  MRN: [id number]  DOB: 02/26/1961    Chief complaint: The patient is a 59 y.o. old female who came in today for Diabetes and Esophageal Reflux    HPI:      Diabetes: Last HbA1C was 8%. Patient denies symptoms of polydipsia, polyuria, hypoglycemic unawareness, , chest pain, dyspnea on exertion, diarrhea, foot ulcerations, paresthesias. Symptoms are not changed. Patient's home blood sugars are poorly controlled. Patient is compliant with medications, but has had confusion about recent med changes. Complications are not known to include retinopathy, nephropathy, or neuropathy. She does have multiple co-morbidities for ASCVD, including hyperlipidemia, obesity, and hypertension.     GERD: Patient reports symptoms of acid reflux, and denies symptoms of abdominal pain, nausea/vomiting, diarrhea, dysphagia, odynophagia, melena, hematochezia. Symptoms have been present for several years and are described as controlled. Medications and dietary changes alleviate symptoms. Symptoms are not exacerbated by caffeine, tobacco use, alcohol use, NSAID medications, aspirin, or corticosteroid use.  Her symptoms were relieved with rimantadine but this is been removed from the market.  She is interested in alternative treatment.  She has tried over-the-counter medications without success.      ROS:  Review of systems completed with all positives and pertinent negatives as per HPI. Otherwise, negative.      Past medical history:  Past Medical History:   Diagnosis Date   . Diabetes mellitus, type 2 (CMS HCC)    . H/O complete eye exam 01/2016   . H/O mammogram 2017   . High cholesterol    . History of dental examination 04/2016   . Hypertension    . Sleep apnea    . Vaginal Pap smear 10/2014     Past surgical history:  Past Surgical History:   Procedure Laterality Date   .  HX CESAREAN SECTION     . HX TONSILLECTOMY     . HX WISDOM TEETH EXTRACTION     . MOLE REMOVAL      several all normal     Medications:  Current Outpatient Medications   Medication Sig   . ASPIRIN (ASPIR-81 ORAL) Once a day   . Blood Sugar Diagnostic (ASCENSIA CONTOUR) Strip 1 Strip by Does not apply route Once a day   . Blood Sugar Diagnostic (ASCENSIA CONTOUR) Strip Test daily   . busPIRone (BUSPAR) 5 mg Oral Tablet Take 1 Tab (5 mg total) by mouth Three times a day as needed (anxiety)   . escitalopram oxalate (LEXAPRO) 20 mg Oral Tablet TAKE 1 TABLET BY MOUTH EVERY DAY   . famotidine (PEPCID) 20 mg Oral Tablet Take 20 mg by mouth Twice daily   . flash glucose scanning reader (FREESTYLE LIBRE 14 DAY READER) Does not apply Misc For checking blood sugars 4-5 times daily.   . flash glucose sensor (FREESTYLE LIBRE 14 DAY SENSOR) Does not apply Kit For checking blood sugars 4-5 times daily. On 4 insulin injections daily.   Marland Kitchen GLUCOSAM/GLUC SU/AC-ALP-D-GLUC (GLUCOSAMINE COMPLEX ORAL) Once a day   . lisinopriL (PRINIVIL) 20 mg Oral Tablet TAKE 1 TABLET (20 MG TOTAL) BY MOUTH ONCE A DAY   . metFORMIN (GLUCOPHAGE XR) 500 mg Oral Tablet Sustained Release 24 hr Take 2 tabs by mouth daily.   . MULTIVITAMIN (MULTIPLE VITAMINS ORAL) Once a day   . pantoprazole (PROTONIX) 40 mg Oral Tablet,  Delayed Release (E.C.) Take 1 Tablet (40 mg total) by mouth Once a day   . simvastatin (ZOCOR) 40 mg Oral Tablet TAKE 1 TABLET BY MOUTH EVERY DAY   . vitamin E 100 unit Oral Capsule Take 100 Units by mouth Once a day     Allergies:  No Known Allergies     Family history:  Family Medical History:     Problem Relation (Age of Onset)    Breast Cancer Mother    Diabetes Mother, Maternal Grandfather    High Cholesterol Mother, Brother    Hypertension (High Blood Pressure) Mother, Father, Brother    Liver Cancer Father    Lymphoma Maternal Aunt    Melanoma Maternal Aunt    No Known Problems Sister, Maternal Grandmother, Paternal Grandmother,  Paternal 36, Daughter, Son, Maternal Uncle, Paternal 33, Paternal Uncle, Other        Social history:  Social History     Socioeconomic History   . Marital status: Married     Spouse name: Not on file   . Number of children: Not on file   . Years of education: Not on file   . Highest education level: Not on file   Occupational History   . Occupation: math Buyer, retail   Tobacco Use   . Smoking status: Never Smoker   . Smokeless tobacco: Never Used   Substance and Sexual Activity   . Alcohol use: Yes     Comment: 1 or 2 drinks per week     Social Determinants of Health     Financial Resource Strain:    . Difficulty of Paying Living Expenses:    Food Insecurity:    . Worried About Charity fundraiser in the Last Year:    . Arboriculturist in the Last Year:    Transportation Needs:    . Film/video editor (Medical):    Marland Kitchen Lack of Transportation (Non-Medical):    Physical Activity:    . Days of Exercise per Week:    . Minutes of Exercise per Session:    Stress:    . Feeling of Stress :    Intimate Partner Violence:    . Fear of Current or Ex-Partner:    . Emotionally Abused:    Marland Kitchen Physically Abused:    . Sexually Abused:        Vitals:    03/12/20 1557   BP: (!) 140/94   Pulse: 90   Resp: 16   Temp: 36.4 C (97.5 F)   SpO2: 97%   Weight: 101 kg (222 lb)   Height: 1.575 m ('5\' 2"' )   BMI: 40.69     Physical Examination:    GENERAL: Pt is a pleasant, well-nourished, well-developed 59 y.o. female who is in NAD. Appears stated age  37:  head normocephalic, symmetrical facies. EOM intact b/l. PERRLA. Sclera non-icteric, non-injected. No rhinorrhea. Oropharyngeal mucous membranes are moist  w/o erythema/exudates   NECK: supple. No masses, lymphadenopathy, JVD on exam. Trachea midline, no thyromegaly   CV: S1, S2. No murmurs, rubs, gallops.     LUNGS: CTAB, No rhonchi, rales, wheezes.   GI: (+) BS in all 4 quadrants. soft, NT/ND. No rigidity/ guarding/ rebound. No organomegaly/masses, or abdominal  bruits  GU: deferred  MSK: Spontaneous normal AROM of major joints as observed during exam. No joint effusions/ swelling/ deformities.   No BLE edema, clubbing, or cyanosis.   2+ radial pulse present b/l.   + 2  reflexes Brachioradialis and patellar bilaterally.   Gait normal.  SKIN: No significant lesions, rashes, ecchymoses noted.  Multiple skin tags appreciated about the neckline bilaterally.  NEURO: AAOx4, CN grossly intact. Sensation intact b/l. No focal deficits.  PSYCH: Mood, behavior and affect normal w/ Intact judgement & insight     Diabetes Monitors  A1C: 9.7  A1C Date: 03/12/2020           Nephropathy Screening: On ACEI or ARB  Lab Results   Component Value Date    CHOLESTEROL 173 03/16/2019    HDLCHOL 50 03/16/2019    LDLCHOL 91 03/16/2019    TRIG 159 03/16/2019     Retinal Exam Date: 05/29/2018  Last diabetic foot exam: Not Found    Visit Diagnosis    Type 2 diabetes mellitus without complication, without long-term current use of insulin (CMS HCC)  Chronic, uncontrolled. HbA1C increased from 6.9% to 8.0%, to 9.7 today.  Patient admits to poor compliance with diabetic diet. Encouraged decrease carbohydrate and sugar intake. Encouraged increased activity for weight loss.   Check blood sugars only as needed and for reference for her dietary changes.   There has been confusion regarding her metformin use.  I will prescribe her metformin 500 mg to take 3 times a day.    She has had better controlled of 1000 mg a day.   1500 mg/day should be able to drive her K5L back to 7.   Continue lisinopril 20 mg daily.   Continue simvastatin 40 mg daily.   Will need foot exam.   Patient follows with ophthalmologist annually.     Essential hypertension  Chronic, uncontrolled. Encouraged lifestyle modifications.   Continue behavioral efforts, esp  Cardiac friendly diet,   < 2 gm salt q day.   Exercise 20 min q day most days of the week.  Continue ACEI - lisinopril 20 mg daily.   Labs ordered.     Anxiety and  depression  Chronic, controlled with medication.   No SI/HI thoughts voiced at this time.   Continue Lexapro 20 mg daily.   PHQ next encounter.   Restart BuSpar 5 mg TID PRN for breakthrough anxiety.     Vaginal itching  Consider atrophic vaginitis   Patient was invited back to the office in 2-3 weeks for pelvic exam    Screening for HIV (human immunodeficiency virus)  Encounter for hepatitis C virus screening test for high risk patient  Discussed HIV and HCV screening with patient in office.   Ordered with labs.     Screening for breast cancer  Mammogram ordered.     Return in about 3 months (around 06/11/2020), or if symptoms worsen or fail to improve, for In Person Visit, diabetes, hypertension.    Cherly Beach, MD  03/12/2020, 16:21  Electronically signed by Cherly Beach, MD    This note was partially generated using MModal Fluency Direct system, and there may be some incorrect words, spellings, and punctuation that were not noted in checking the note before saving.

## 2020-03-12 NOTE — Addendum Note (Signed)
Addended by: Wynona Meals on: 03/12/2020 04:32 PM     Modules accepted: Orders

## 2020-03-19 ENCOUNTER — Telehealth (HOSPITAL_BASED_OUTPATIENT_CLINIC_OR_DEPARTMENT_OTHER): Payer: Self-pay | Admitting: Family Medicine

## 2020-03-31 ENCOUNTER — Encounter (HOSPITAL_BASED_OUTPATIENT_CLINIC_OR_DEPARTMENT_OTHER): Payer: Self-pay | Admitting: Family Medicine

## 2020-03-31 ENCOUNTER — Ambulatory Visit (INDEPENDENT_AMBULATORY_CARE_PROVIDER_SITE_OTHER): Payer: 59 | Admitting: Family Medicine

## 2020-03-31 ENCOUNTER — Ambulatory Visit: Payer: 59 | Attending: Family Medicine

## 2020-03-31 ENCOUNTER — Other Ambulatory Visit: Payer: Self-pay

## 2020-03-31 ENCOUNTER — Other Ambulatory Visit (HOSPITAL_BASED_OUTPATIENT_CLINIC_OR_DEPARTMENT_OTHER): Payer: 59

## 2020-03-31 VITALS — BP 118/72 | HR 96 | Temp 97.4°F | Resp 16 | Ht 62.0 in | Wt 220.0 lb

## 2020-03-31 DIAGNOSIS — Z124 Encounter for screening for malignant neoplasm of cervix: Secondary | ICD-10-CM | POA: Insufficient documentation

## 2020-03-31 DIAGNOSIS — Z01419 Encounter for gynecological examination (general) (routine) without abnormal findings: Secondary | ICD-10-CM

## 2020-03-31 DIAGNOSIS — E119 Type 2 diabetes mellitus without complications: Secondary | ICD-10-CM | POA: Insufficient documentation

## 2020-03-31 DIAGNOSIS — Z1239 Encounter for other screening for malignant neoplasm of breast: Secondary | ICD-10-CM

## 2020-03-31 LAB — MICROALBUMIN URINE, RANDOM
CREATININE RANDOM URINE: 152 mg/dL — ABNORMAL HIGH (ref 50–100)
MICROALBUMIN RANDOM URINE: 2 mg/dL
MICROALBUMIN/CREATININE: 0.013 mg/mg (ref <2.000)

## 2020-03-31 LAB — LIPID PANEL
CHOL/HDL RATIO: 3.6
CHOLESTEROL: 153 mg/dL (ref 100–200)
HDL CHOL: 42 mg/dL — ABNORMAL LOW (ref >=50)
LDL CALC: 82 mg/dL (ref <100)
NON-HDL: 111 mg/dL (ref <=190)
TRIGLYCERIDES: 144 mg/dL (ref <150)
VLDL CALC: 29 mg/dL (ref <30)

## 2020-03-31 LAB — HGA1C (HEMOGLOBIN A1C WITH EST AVG GLUCOSE): HEMOGLOBIN A1C: 9.6 % — ABNORMAL HIGH (ref <5.7)

## 2020-03-31 NOTE — Progress Notes (Signed)
[place]  [facility]  [place] [address]  [phone]    03/31/2020    Andrea Huerta  December 17, 1960    PCP: Cherly Beach, MD    Chief Complaint   Patient presents with   . Gyn Exam     its been a few years. No issues       HPI     Patient presents to the office to the office for a PAP smear and breast exam.     Patient is post-menopausal x 4 years, denies vaginal discharge or bleeding. Does endorse mild dyspareunia, is using lubricant. She denies rectal bleeding. She denies abdominal bloating.     She does not complete self breast exams but denies breast tenderness or breast changes. She did recently have her COVID vaccine and reports left axilla swelling and tenderness that has since resolved.    She has no other concerns today.      Past Medical History  Current Outpatient Medications   Medication Sig   . ASPIRIN (ASPIR-81 ORAL) Once a day   . Blood Sugar Diagnostic (ASCENSIA CONTOUR) Strip 1 Strip by Does not apply route Once a day   . Blood Sugar Diagnostic (ASCENSIA CONTOUR) Strip Test daily   . busPIRone (BUSPAR) 5 mg Oral Tablet Take 1 Tab (5 mg total) by mouth Three times a day as needed (anxiety)   . escitalopram oxalate (LEXAPRO) 20 mg Oral Tablet TAKE 1 TABLET BY MOUTH EVERY DAY   . flash glucose scanning reader (FREESTYLE LIBRE 14 DAY READER) Does not apply Misc For checking blood sugars 4-5 times daily.   . flash glucose sensor (FREESTYLE LIBRE 14 DAY SENSOR) Does not apply Kit For checking blood sugars 4-5 times daily. On 4 insulin injections daily.   Marland Kitchen GLUCOSAM/GLUC SU/AC-ALP-D-GLUC (GLUCOSAMINE COMPLEX ORAL) Once a day   . lisinopriL (PRINIVIL) 20 mg Oral Tablet TAKE 1 TABLET (20 MG TOTAL) BY MOUTH ONCE A DAY   . metFORMIN (GLUCOPHAGE XR) 500 mg Oral Tablet Sustained Release 24 hr Take 2 tabs by mouth daily.   . metFORMIN (GLUCOPHAGE) 500 mg Oral Tablet Take 1 Tablet (500 mg total) by mouth Three times daily with meals   . MULTIVITAMIN (MULTIPLE VITAMINS ORAL)  Once a day   . pantoprazole (PROTONIX) 40 mg Oral Tablet, Delayed Release (E.C.) Take 1 Tablet (40 mg total) by mouth Once a day   . simvastatin (ZOCOR) 40 mg Oral Tablet TAKE 1 TABLET BY MOUTH EVERY DAY   . vitamin E 100 unit Oral Capsule Take 100 Units by mouth Once a day     No Known Allergies  Past Medical History:   Diagnosis Date   . Diabetes mellitus, type 2 (CMS HCC)    . H/O complete eye exam 01/2016   . H/O mammogram 2017   . High cholesterol    . History of dental examination 04/2016   . Hypertension    . Sleep apnea    . Vaginal Pap smear 10/2014         Past Surgical History:   Procedure Laterality Date   . HX CESAREAN SECTION     . HX TONSILLECTOMY     . HX WISDOM TEETH EXTRACTION     . MOLE REMOVAL      several all normal         Family Medical History:     Problem Relation (Age of Onset)    Breast Cancer Mother    Diabetes  Mother, Maternal Grandfather    High Cholesterol Mother, Brother    Hypertension (High Blood Pressure) Mother, Father, Brother    Liver Cancer Father    Lymphoma Maternal Aunt    Melanoma Maternal Aunt    No Known Problems Sister, Maternal Grandmother, Paternal Grandmother, Paternal 26, Daughter, Son, Maternal Uncle, Paternal 51, Paternal Uncle, Other            Social History     Socioeconomic History   . Marital status: Married     Spouse name: Not on file   . Number of children: Not on file   . Years of education: Not on file   . Highest education level: Not on file   Occupational History   . Occupation: math Buyer, retail   Tobacco Use   . Smoking status: Never Smoker   . Smokeless tobacco: Never Used   Substance and Sexual Activity   . Alcohol use: Yes     Comment: 1 or 2 drinks per week     Social Determinants of Health     Financial Resource Strain:    . Difficulty of Paying Living Expenses:    Food Insecurity:    . Worried About Charity fundraiser in the Last Year:    . Arboriculturist in the Last Year:    Transportation Needs:    . Film/video editor  (Medical):    Marland Kitchen Lack of Transportation (Non-Medical):    Physical Activity:    . Days of Exercise per Week:    . Minutes of Exercise per Session:    Stress:    . Feeling of Stress :    Intimate Partner Violence:    . Fear of Current or Ex-Partner:    . Emotionally Abused:    Marland Kitchen Physically Abused:    . Sexually Abused:        Vitals:    03/31/20 0813   BP: 118/72   Pulse: 96   Resp: 16   Temp: 36.3 C (97.4 F)   SpO2: 99%   Weight: 99.8 kg (220 lb)   Height: 1.575 m ('5\' 2"' )   BMI: 40.32        Wt Readings from Last 6 Encounters:   03/31/20 99.8 kg (220 lb)   03/12/20 101 kg (222 lb)   03/16/19 99.8 kg (220 lb)   08/05/17 99.9 kg (220 lb 3.2 oz)   03/09/17 96.6 kg (213 lb)   01/12/17 94.8 kg (209 lb)       Nursing Notes:   Angelene Giovanni, LPN  93/81/82 9937  Signed     03/31/20 0800   PHQ 9 (follow up)   Little interest or pleasure in doing things. 1   Feeling down, depressed, or hopeless 1   Trouble falling or staying asleep, or sleeping too much. 0   Feeling tired or having little energy 1   Poor appetite or overeating 2   Feeling bad about yourself/ that you are a failure in the past 2 weeks? 1   Trouble concentrating on things in the past 2 weeks? 0   Moving/Speaking slowly or being fidgety or restless  in the past 2 weeks? 0   Thoughts that you would be better off DEAD, or of hurting yourself in some way. 0   If you checked off any problems, how difficult have these problems made it for you to do your work, take care of things at home, or get along with other  people? Somewhat difficult   PHQ 9 Total 6               Review of Systems   Respiratory: Negative for cough, shortness of breath and wheezing.    Cardiovascular: Negative for chest pain.   Gastrointestinal: Negative for abdominal distention, abdominal pain, anal bleeding, constipation and diarrhea.   Genitourinary: Positive for dyspareunia. Negative for decreased urine volume, difficulty urinating, dysuria, pelvic pain, urgency, vaginal bleeding, vaginal  discharge and vaginal pain.       Physical Exam  Vitals and nursing note reviewed. Exam conducted with a chaperone present Cleatis Polka, LPN ).   Constitutional:       Appearance: Normal appearance. She is well-developed and well-groomed. She is morbidly obese. She is not ill-appearing, toxic-appearing or diaphoretic.      Comments: Wearing facemask per office policy.      HENT:      Head: Normocephalic and atraumatic.   Eyes:      General: Lids are normal.      Extraocular Movements: Extraocular movements intact.      Conjunctiva/sclera: Conjunctivae normal.   Chest:      Breasts: Breasts are symmetrical.         Right: Normal.         Left: Normal.   Abdominal:      General: Abdomen is protuberant. Bowel sounds are normal.      Tenderness: There is no abdominal tenderness.      Hernia: There is no hernia in the left inguinal area or right inguinal area.   Genitourinary:     General: Normal vulva.      Exam position: Lithotomy position.      Labia:         Right: No rash, tenderness, lesion or injury.         Left: No rash, tenderness, lesion or injury.       Vagina: Normal.      Cervix: Normal.      Uterus: Normal.       Adnexa: Right adnexa normal and left adnexa normal.      Rectum: Normal.   Musculoskeletal:      Cervical back: Neck supple.   Lymphadenopathy:      Upper Body:      Right upper body: No axillary or pectoral adenopathy.      Left upper body: No axillary or pectoral adenopathy.   Skin:     General: Skin is warm.      Capillary Refill: Capillary refill takes less than 2 seconds.   Neurological:      General: No focal deficit present.      Mental Status: She is alert.      Cranial Nerves: Cranial nerves are intact.      Motor: Motor function is intact.      Coordination: Coordination is intact.   Psychiatric:         Attention and Perception: Attention normal.         Mood and Affect: Mood normal.         Speech: Speech normal.         Behavior: Behavior normal. Behavior is cooperative.          Cognition and Memory: Cognition and memory normal.         Judgment: Judgment normal.         DIAGNOSIS:      ICD-10-CM    1. Encounter for Papanicolaou smear for cervical  cancer screening  Z12.4 CYTOPATHOLOGY, GYN +/- HIGH RISK HPV   2. Screening breast examination  Z12.39        PLAN:    Pap smear completed without difficulty, patient tolerated well, further recommendations will based upon results.  Advised patient that results can take 7-10 days, will be notified via letter with negative results or via telephone with positive results.    Patient verbalized understanding of treatment plan without further questions or concerns.    Return if symptoms worsen or fail to improve, for Keep scheduled appointment with Dr. Russ Halo.      No results were found from the past 30 days.      Andrea Nigh, APRN     I have reviewed this note in its entirety and agree with clinical staff on their assessments. Patient consented and agreed to the course of treatment. I have counseled patient and answered all questions.      This note may have been partially generated using MModal Fluency Direct system, and there may be some incorrect words, spellings, and punctuation that were not noted in checking the note before saving, though effort was made to avoid such errors.

## 2020-03-31 NOTE — Nursing Note (Signed)
03/31/20 0800   PHQ 9 (follow up)   Little interest or pleasure in doing things. 1   Feeling down, depressed, or hopeless 1   Trouble falling or staying asleep, or sleeping too much. 0   Feeling tired or having little energy 1   Poor appetite or overeating 2   Feeling bad about yourself/ that you are a failure in the past 2 weeks? 1   Trouble concentrating on things in the past 2 weeks? 0   Moving/Speaking slowly or being fidgety or restless  in the past 2 weeks? 0   Thoughts that you would be better off DEAD, or of hurting yourself in some way. 0   If you checked off any problems, how difficult have these problems made it for you to do your work, take care of things at home, or get along with other people? Somewhat difficult   PHQ 9 Total 6

## 2020-04-01 ENCOUNTER — Encounter (HOSPITAL_BASED_OUTPATIENT_CLINIC_OR_DEPARTMENT_OTHER): Payer: Self-pay | Admitting: Family Medicine

## 2020-04-01 LAB — CYTOPATHOLOGY, GYN +/- HIGH RISK HPV

## 2020-04-02 ENCOUNTER — Encounter (HOSPITAL_BASED_OUTPATIENT_CLINIC_OR_DEPARTMENT_OTHER): Payer: Self-pay | Admitting: Family Medicine

## 2020-04-04 NOTE — Progress Notes (Signed)
Please inform the patient that her labs are back.  Her hemoglobin A1c has increased to 9.6 from 6.93 years ago.  This demonstrates poor diabetic control.    At this point I would stress continued compliance with her diet and medication.  If she needs a visit please make that happen.    Her urine showed she was not spilling significant amounts of protein.  This is very good news.    Her lipid panel showed her total cholesterol was 153.  This is at goal.  Her bad cholesterol or LDL cholesterol was 82. This is near goal and is the best she has had in 3 years.  Encouraged her to continue her efforts at avoiding dietary saturated fats and exercising 20 minutes a day 6 days a week.    As always we will review these results together at our next appointment.  TY TH

## 2020-04-04 NOTE — Progress Notes (Signed)
Called and notified pt of results, pt verbally understood.    Trinitie Mcgirr, LPN

## 2020-05-11 ENCOUNTER — Emergency Department (INDEPENDENT_AMBULATORY_CARE_PROVIDER_SITE_OTHER)
Admission: EM | Admit: 2020-05-11 | Discharge: 2020-05-11 | Disposition: A | Discharge status: Home / Self Care | Payer: Medicare Other | Source: Home / Self Care | Attending: Family Medicine | Admitting: Family Medicine

## 2020-05-11 ENCOUNTER — Other Ambulatory Visit: Payer: Self-pay

## 2020-05-11 ENCOUNTER — Emergency Department (INDEPENDENT_AMBULATORY_CARE_PROVIDER_SITE_OTHER): Payer: Medicare Other

## 2020-05-11 DIAGNOSIS — R059 Cough, unspecified: Secondary | ICD-10-CM | POA: Diagnosis not present

## 2020-05-11 DIAGNOSIS — J209 Acute bronchitis, unspecified: Secondary | ICD-10-CM

## 2020-05-11 DIAGNOSIS — Z9189 Other specified personal risk factors, not elsewhere classified: Secondary | ICD-10-CM

## 2020-05-11 HISTORY — DX: Depression, unspecified: F32.A

## 2020-05-11 HISTORY — DX: Type 2 diabetes mellitus without complications: E11.9

## 2020-05-11 HISTORY — DX: Gastro-esophageal reflux disease without esophagitis: K21.9

## 2020-05-11 HISTORY — DX: Cerebral infarction, unspecified: I63.9

## 2020-05-11 MED ORDER — DOXYCYCLINE HYCLATE 100 MG PO CAPS: ORAL_CAPSULE | ORAL | 0 refills | Status: AC

## 2020-05-11 MED ORDER — ALBUTEROL SULFATE HFA 108 (90 BASE) MCG/ACT IN AERS: 2.0000 | INHALATION_SPRAY | Freq: Four times a day (QID) | RESPIRATORY_TRACT | 0 refills | Status: AC | PRN

## 2020-05-11 NOTE — Discharge Instructions (Addendum)
Take plain guaifenesin (1200mg  extended release tabs such as Mucinex) twice daily, with plenty of water, for cough and congestion.  May add Pseudoephedrine (30mg , one or two every 4 to 6 hours) for sinus congestion.  Get adequate rest.   May take Delsym Cough Suppressant ("12 Hour Cough Relief") at bedtime for nighttime cough.  Try warm salt water gargles for sore throat.  Stop all antihistamines (Nyquil and Claritin) for now, and other non-prescription cough/cold preparations.  If symptoms become significantly worse during the night or over the weekend, proceed to the local emergency room.

## 2020-05-11 NOTE — ED Triage Notes (Signed)
Pt presents to Urgent Care with c/o cough x one week. She states she is coughing up yellow sputum.  Pt w/ no known COVID exposure; has been vaccinated. Pt states she had a cough approx 3 weeks ago and was tested for COVID, and the test was negative. She states she took a course of amoxicillin and her symptoms subsided before this episode of cough began.

## 2020-05-11 NOTE — ED Provider Notes (Signed)
[facility]    CSN: [id number] Arrival date & time: 05/11/20  1328      History   Chief Complaint Chief Complaint  Patient presents with  . Cough    HPI Mary Zuniga is a 59 y.o. female.   Patient reports that she had a URI about three weeks ago that resolved after she took an antibiotic. A COVID test at that time was negative.  She now complains of recurrent cough for one week, productive of yellow sputum.  She denies pleuritic pain, shortness of breath, and fevers, chills, and sweats.  The history is provided by the patient.    Past Medical History:  Diagnosis Date  . Depression   . Diabetes mellitus without complication (HCC)   . GERD (gastroesophageal reflux disease)   . Stroke [facility])     There are no problems to display for this patient.   History reviewed. No pertinent surgical history.  OB History   No obstetric history on file.      Home Medications    Prior to Admission medications   Medication Sig Start Date End Date Taking? Authorizing Provider  apixaban (ELIQUIS) 5 MG TABS tablet Take 5 mg by mouth daily.  12/10/16  Yes [provider]  atorvastatin (LIPITOR) 20 MG tablet Take 2 tablets by mouth daily. 12/07/16  Yes [provider]  Cariprazine HCl (VRAYLAR) 6 MG CAPS TAKE 1 CAPSULE (6 MG TOTAL) BY MOUTH DAILY. 12/07/16  Yes [provider]  clonazePAM (KLONOPIN) 0.5 MG tablet Take 1 tablet at night for sleep. If not effective, may take up to 2 tablets at night for sleep 02/23/17  Yes [provider]  cyanocobalamin 1000 MCG tablet Take 1,000 mcg by mouth daily.  12/11/18  Yes [provider]  glucose blood (PRECISION QID TEST) test strip Use 1 strip to check blood sugar 2 times a day. Please dispense based on availability, insurance coverage, and patient preference. 01/07/17  Yes [provider]  Lancet Devices (SIMPLE DIAGNOSTICS LANCING DEV) MISC Use to lance skin to check blood sugar levels  2 times daily.  Please dispense based on availability, insurance coverage, and pt preference. 01/07/17  Yes [provider]  metFORMIN (GLUCOPHAGE) 1000 MG tablet Take 1,000 mg by mouth daily.  12/07/16  Yes [provider]  omeprazole (PRILOSEC) 20 MG capsule Take 1 tablet by mouth daily. 12/07/16  Yes [provider]  albuterol (VENTOLIN HFA) 108 (90 Base) MCG/ACT inhaler Inhale 2 puffs into the lungs every 6 (six) hours as needed for wheezing or shortness of breath. 05/11/20   Lattie Haw, MD  doxycycline (VIBRAMYCIN) 100 MG capsule Take one cap PO Q12hr with food. 05/11/20   Lattie Haw, MD  QUEtiapine (SEROQUEL) 50 MG tablet Take 50 mg by mouth at bedtime.  05/05/20   [provider]    Family History Family History  Problem Relation Age of Onset  . Hypertension Mother   . Diabetes Mother   . Healthy Father     Social History Social History   Tobacco Use  . Smoking status: Current Every Day Smoker    Packs/day: 0.50    Years: 25.00    Pack years: 12.50    Types: Cigarettes  . Smokeless tobacco: Never Used  Substance Use Topics  . Alcohol use: Not Currently  . Drug use: Not Currently     Allergies   Meloxicam and Naproxen   Review of Systems Review of Systems No sore  throat + cough No pleuritic pain ? wheezing + nasal congestion + post-nasal drainage No sinus pain/pressure No itchy/red eyes No earache No hemoptysis No SOB + fever, + chills No nausea No vomiting No abdominal pain No diarrhea No urinary symptoms No skin rash + fatigue No myalgias No headache Used OTC meds (Nyquil, Daquil) without relief   Physical Exam Triage Vital Signs ED Triage Vitals  Enc Vitals Group     BP 05/11/20 1455 117/77     Pulse --      Resp 05/11/20 1455 18     Temp 05/11/20 1455 98.4 F (36.9 C)     Temp Source 05/11/20 1455 Oral     SpO2 05/11/20 1455 98 %     Weight --      Height --      Head Circumference --       Peak Flow --      Pain Score 05/11/20 1443 0     Pain Loc --      Pain Edu? --      Excl. in GC? --    No data found.  Updated Vital Signs BP 117/77 (BP Location: Left Arm)   Temp 98.4 F (36.9 C) (Oral)   Resp 18   SpO2 98%   Visual Acuity Right Eye Distance:   Left Eye Distance:   Bilateral Distance:    Right Eye Near:   Left Eye Near:    Bilateral Near:     Physical Exam Nursing notes and Vital Signs reviewed. Appearance:  Patient appears stated age, and in no acute distress Eyes:  Pupils are equal, round, and reactive to light and accomodation.  Extraocular movement is intact.  Conjunctivae are not inflamed  Ears:  Canals normal.  Tympanic membranes normal.  Nose:  Mildly congested turbinates.  No sinus tenderness.  Pharynx:  Normal Neck:  Supple.  Mildly enlarged lateral nodes are present, tender to palpation on the left.   Lungs:  Bilateral wheezes all fields.  Breath sounds are equal.  Moving air well. Heart:  Regular rate and rhythm without murmurs, rubs, or gallops.  Abdomen:  Nontender without masses or hepatosplenomegaly.  Bowel sounds are present.  No CVA or flank tenderness.  Extremities:  No edema.  Skin:  No rash present.   UC Treatments / Results  Labs (all labs ordered are listed, but only abnormal results are displayed) Labs Reviewed  NOVEL CORONAVIRUS, NAA    EKG   Radiology DG Chest 2 View  Result Date: 05/11/2020 CLINICAL DATA:  Persistent cough for 2 weeks EXAM: CHEST - 2 VIEW COMPARISON:  None. FINDINGS: Mild bronchitic changes. No focal opacity or pleural effusion. Normal cardiomediastinal silhouette. No pneumothorax. IMPRESSION: No active cardiopulmonary disease. Electronically Signed   By: Jasmine Pang M.D.   On: 05/11/2020 15:54    Procedures Procedures (including critical care time)  Medications Ordered in UC Medications - No data to display  Initial Impression / Assessment and Plan / UC Course  I have reviewed the triage  vital signs and the nursing notes.  Pertinent labs & imaging results that were available during my care of the patient were reviewed by me and considered in my medical decision making (see chart for details).    Begin doxycycline.  Rx for albuterol MDI. Followup with Family Doctor if not improved in about 8 days.   Final Clinical Impressions(s) / UC Diagnoses   Final diagnoses:  At increased risk of exposure to COVID-19 virus  Acute bronchitis, unspecified organism     Discharge Instructions     Take plain guaifenesin (1200mg  extended release tabs such as Mucinex) twice daily, with plenty of water, for cough and congestion.  May add Pseudoephedrine (30mg , one or two every 4 to 6 hours) for sinus congestion.  Get adequate rest.   May take Delsym Cough Suppressant ("12 Hour Cough Relief") at bedtime for nighttime cough.  Try warm salt water gargles for sore throat.  Stop all antihistamines (Nyquil and Claritin) for now, and other non-prescription cough/cold preparations.  If symptoms become significantly worse during the night or over the weekend, proceed to the local emergency room.     ED Prescriptions    Medication Sig Dispense Auth. Provider   doxycycline (VIBRAMYCIN) 100 MG capsule Take one cap PO Q12hr with food. 20 capsule , MD   albuterol (VENTOLIN HFA) 108 (90 Base) MCG/ACT inhaler Inhale 2 puffs into the lungs every 6 (six) hours as needed for wheezing or shortness of breath. 8 g , MD        Lattie Haw, MD 05/20/20 [phone]

## 2020-05-13 LAB — SARS-COV-2, NAA 2 DAY TAT

## 2020-05-13 LAB — NOVEL CORONAVIRUS, NAA: SARS-CoV-2, NAA: NOT DETECTED

## 2020-05-16 ENCOUNTER — Encounter (HOSPITAL_BASED_OUTPATIENT_CLINIC_OR_DEPARTMENT_OTHER): Payer: Self-pay | Admitting: Family Medicine

## 2020-05-16 ENCOUNTER — Other Ambulatory Visit (HOSPITAL_BASED_OUTPATIENT_CLINIC_OR_DEPARTMENT_OTHER): Payer: Self-pay | Admitting: Family Medicine

## 2020-05-16 DIAGNOSIS — J019 Acute sinusitis, unspecified: Secondary | ICD-10-CM

## 2020-05-16 MED ORDER — DOXYCYCLINE HYCLATE 100 MG CAPSULE
100.0000 mg | ORAL_CAPSULE | Freq: Two times a day (BID) | ORAL | 0 refills | Status: AC
Start: 2020-05-16 — End: 2020-05-26

## 2020-06-26 ENCOUNTER — Encounter (HOSPITAL_BASED_OUTPATIENT_CLINIC_OR_DEPARTMENT_OTHER): Payer: Self-pay | Admitting: Family Medicine

## 2020-07-02 ENCOUNTER — Encounter (HOSPITAL_BASED_OUTPATIENT_CLINIC_OR_DEPARTMENT_OTHER): Payer: Self-pay | Admitting: Family Medicine

## 2020-07-11 ENCOUNTER — Encounter (HOSPITAL_BASED_OUTPATIENT_CLINIC_OR_DEPARTMENT_OTHER): Payer: Self-pay | Admitting: Family Medicine

## 2020-07-16 ENCOUNTER — Other Ambulatory Visit (HOSPITAL_BASED_OUTPATIENT_CLINIC_OR_DEPARTMENT_OTHER): Payer: Self-pay | Admitting: Family Medicine

## 2020-07-16 DIAGNOSIS — E119 Type 2 diabetes mellitus without complications: Secondary | ICD-10-CM

## 2020-07-16 MED ORDER — BLOOD SUGAR DIAGNOSTIC STRIPS
1.0000 | ORAL_STRIP | Freq: Two times a day (BID) | 2 refills | Status: AC
Start: 2020-07-16 — End: ?

## 2020-07-16 NOTE — Telephone Encounter (Signed)
From: Lawernce Ion  To: Cherly Beach, MD  Sent: 07/11/2020 10:04 AM EST  Subject: Free Style Lite Test Strips    I need a new prescription called in to the [facility] on [address] for Free Style Lite Test Strips.    Thank you,  Margarita Grizzle

## 2020-07-16 NOTE — Telephone Encounter (Signed)
-----   Message from Cherly Beach, MD sent at 07/15/2020  5:05 PM EST -----  Regarding: RE: Free Style Lite Test Strips    ----- Message -----  From: Ellard Artis, MA  Sent: 07/15/2020   3:40 PM EST  To: Cherly Beach, MD  Subject: FW: Free Style Lite Test Strips                    ----- Message -----  From: Andrea Huerta  Sent: 07/11/2020  10:04 AM EST  To: [facility] Nurse Triage  Subject: Free Style Lite Test Strips                      I need a new prescription called in to the [facility] on [address] for [employer] Strips.    Thank you,  Andrea Huerta

## 2020-08-12 ENCOUNTER — Ambulatory Visit: Payer: 59 | Attending: Family Medicine | Admitting: Family Medicine

## 2020-08-12 ENCOUNTER — Other Ambulatory Visit: Payer: Self-pay

## 2020-08-12 VITALS — BP 128/88 | HR 86 | Temp 96.7°F | Resp 14 | Ht 62.0 in | Wt 216.2 lb

## 2020-08-12 DIAGNOSIS — R7309 Other abnormal glucose: Secondary | ICD-10-CM | POA: Insufficient documentation

## 2020-08-12 DIAGNOSIS — E119 Type 2 diabetes mellitus without complications: Secondary | ICD-10-CM | POA: Insufficient documentation

## 2020-08-12 LAB — POCT HGB A1C: POCT HGB A1C: 9.7 % — AB (ref 4–6)

## 2020-08-12 MED ORDER — DULAGLUTIDE 0.75 MG/0.5 ML SUBCUTANEOUS PEN INJECTOR
0.7500 mg | PEN_INJECTOR | SUBCUTANEOUS | 0 refills | Status: DC
Start: 2020-08-12 — End: 2020-09-03

## 2020-08-12 MED ORDER — DULAGLUTIDE 1.5 MG/0.5 ML SUBCUTANEOUS PEN INJECTOR
1.5000 mg | PEN_INJECTOR | SUBCUTANEOUS | 0 refills | Status: DC
Start: 2020-08-12 — End: 2020-10-07

## 2020-08-12 MED ORDER — DULAGLUTIDE 3 MG/0.5 ML SUBCUTANEOUS PEN INJECTOR
3.0000 mg | PEN_INJECTOR | SUBCUTANEOUS | 0 refills | Status: DC
Start: 2020-08-12 — End: 2020-11-03

## 2020-08-12 NOTE — Nursing Note (Signed)
08/12/20 1600   A1C   Time Performed 1653   A1C 9.7   References Ranges 4 - 6%   Initials SH   Lancet used on  Finger

## 2020-08-12 NOTE — Progress Notes (Signed)
[place], [place]  [place]  [place] [address]  [phone]    Patient Name: Andrea Huerta  Date: 08/12/2020  MRN: [id number]  DOB: [birth year]    Chief complaint: The patient is a 60 y.o. old female who came in today for Diabetes (Pt advises sugars in the am are running around 122.)    HPI:      Diabetes: Last HbA1C was up from 8 to 9.4%, today's is the same.  Patient denies symptoms of polydipsia, polyuria, hypoglycemic unawareness, , chest pain, dyspnea on exertion, diarrhea, foot ulcerations, paresthesias. Symptoms are not changed. Patient's home blood sugars are poorly controlled. Patient is compliant with medications, but has had confusion about recent med changes. Complications are not known to include retinopathy, nephropathy, or neuropathy. She does have multiple co-morbidities for ASCVD, including hyperlipidemia, obesity, and hypertension.  She has been working out at [employer].  She is pleased with the results.  She has not yet seen improvement in her blood glucose.    GERD: controlled.     ROS:  Review of systems completed with all positives and pertinent negatives as per HPI. Otherwise, negative.      Past medical history:  Past Medical History:   Diagnosis Date    Diabetes mellitus, type 2 (CMS [facility])     H/O complete eye exam 01/2016    H/O mammogram 2017    High cholesterol     History of dental examination 04/2016    Hypertension     Sleep apnea     Vaginal Pap smear 10/2014     Past surgical history:  Past Surgical History:   Procedure Laterality Date    HX CESAREAN SECTION      HX TONSILLECTOMY      HX WISDOM TEETH EXTRACTION      MOLE REMOVAL      several all normal     Medications:  Current Outpatient Medications   Medication Sig    ASPIRIN (ASPIR-81 ORAL) Once a day    Blood Sugar Diagnostic (ASCENSIA CONTOUR) Strip 1 Strip by Does not apply route Once a day    Blood Sugar Diagnostic (ASCENSIA CONTOUR) Strip Test  daily    Blood Sugar Diagnostic (FREESTYLE LITE STRIPS) Strip 1 Strip Twice daily    busPIRone (BUSPAR) 5 mg Oral Tablet Take 1 Tab (5 mg total) by mouth Three times a day as needed (anxiety)    dulaglutide 0.75 mg/0.5 mL Subcutaneous Pen Injector Inject 0.5 mL (0.75 mg total) under the skin Every 7 days for 30 days    dulaglutide 1.5 mg/0.5 mL Subcutaneous Pen Injector Inject 0.5 mL (1.5 mg total) under the skin Every 7 days for 30 days    dulaglutide 3 mg/0.5 mL Subcutaneous Pen Injector Inject 0.5 mL (3 mg total) under the skin Every 7 days for 30 days    escitalopram oxalate (LEXAPRO) 20 mg Oral Tablet TAKE 1 TABLET BY MOUTH EVERY DAY    flash glucose scanning reader (FREESTYLE LIBRE 14 DAY READER) Does not apply Misc For checking blood sugars 4-5 times daily.    flash glucose sensor (FREESTYLE LIBRE 14 DAY SENSOR) Does not apply Kit For checking blood sugars 4-5 times daily. On 4 insulin injections daily.    GLUCOSAM/GLUC SU/AC-ALP-D-GLUC (GLUCOSAMINE COMPLEX ORAL) Once a day    lisinopriL (PRINIVIL) 20 mg Oral Tablet TAKE 1 TABLET (20 MG TOTAL) BY MOUTH ONCE A DAY    metFORMIN (GLUCOPHAGE XR)  500 mg Oral Tablet Sustained Release 24 hr Take 2 tabs by mouth daily.    metFORMIN (GLUCOPHAGE) 500 mg Oral Tablet Take 1 Tablet (500 mg total) by mouth Three times daily with meals    MULTIVITAMIN (MULTIPLE VITAMINS ORAL) Once a day    pantoprazole (PROTONIX) 40 mg Oral Tablet, Delayed Release (E.C.) Take 1 Tablet (40 mg total) by mouth Once a day    simvastatin (ZOCOR) 40 mg Oral Tablet TAKE 1 TABLET BY MOUTH EVERY DAY    vitamin E 100 unit Oral Capsule Take 100 Units by mouth Once a day     Allergies:  No Known Allergies     Family history:  Family Medical History:     Problem Relation (Age of Onset)    Breast Cancer Mother    Diabetes Mother, Maternal Grandfather    High Cholesterol Mother, Brother    Hypertension (High Blood Pressure) Mother, Father, Brother    Liver Cancer Father    Lymphoma  Maternal Aunt    Melanoma Maternal Aunt    No Known Problems Sister, Maternal Grandmother, Paternal Grandmother, Paternal Grandfather, Daughter, Son, Maternal Uncle, Paternal 57, Paternal Uncle, Other        Social history:  Social History     Socioeconomic History    Marital status: Married   Occupational History    Occupation: math Pharmacist, 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      Physical Examination:    GENERAL: Pt is a pleasant, well-nourished, well-developed 60 y.o. female who is in NAD. Appears stated age  68:  head normocephalic, symmetrical facies. EOM intact b/l. PERRLA. Sclera non-icteric, non-injected. No rhinorrhea. Oropharyngeal mucous membranes are moist  w/o erythema/exudates   NECK: supple. No masses, lymphadenopathy, JVD on exam. Trachea midline, no thyromegaly   CV: S1, S2. No murmurs, rubs, gallops.     LUNGS: CTAB, No rhonchi, rales, wheezes.   GI: (+) BS in all 4 quadrants. soft, NT/ND. No rigidity/ guarding/ rebound. No organomegaly/masses, or abdominal bruits  GU: deferred  MSK: Spontaneous normal AROM of major joints as observed during exam. No joint effusions/ swelling/ deformities.   No BLE edema, clubbing, or cyanosis.   2+ radial pulse present b/l.   + 2 reflexes Brachioradialis and patellar bilaterally.   Gait normal.  SKIN: No significant lesions, rashes, ecchymoses noted.  Multiple skin tags appreciated about the neckline bilaterally.  NEURO: AAOx4, CN grossly intact. Sensation intact b/l. No focal deficits.  PSYCH: Mood, behavior and affect normal w/ Intact judgement & insight   Exam stable but for obvious  weight loss.    Diabetes Monitors  A1C: 9.7  A1C Date: 08/12/2020           Nephropathy Screening: On ACEI or ARB  Lab Results   Component Value Date    CHOLESTEROL 153 03/31/2020    HDLCHOL 42 (  L) 03/31/2020    LDLCHOL 82 03/31/2020    TRIG 144 03/31/2020     Retinal Exam Date: 05/29/2018  Last diabetic foot exam: Not Found    Visit Diagnosis    Type 2 diabetes mellitus without complication, without long-term current use of insulin (CMS HCC)  Chronic, uncontrolled. HbA1C increased from 6.9% to 8.0%, to 9.7 today.  Patient admits to poor compliance with diabetic diet. Encouraged decrease carbohydrate and sugar intake. Encouraged increased activity for weight loss.   Check blood sugars only as needed and for reference for her dietary changes.   There has been confusion regarding her metformin use.  I will prescribe her metformin 500 mg to take 3 times a day.    She has had better controlled of 1000 mg a day.   Will start Trulicity.  Will ramp up q.month.  X3 months.  Patient has used Byetta in the past.  She is aware of side effects.  She agrees to use as ordered.  Continue lisinopril 20 mg daily.   Continue simvastatin 40 mg daily.   Will need foot exam.   Patient follows with ophthalmologist annually.     Essential hypertension  Chronic, uncontrolled. Encouraged lifestyle modifications.   Continue behavioral efforts, esp  Cardiac friendly diet,   < 2 gm salt q day.   Exercise 20 min q day most days of the week.  Continue ACEI - lisinopril 20 mg daily.   Labs ordered.     Anxiety and depression  Chronic, controlled with medication.   No SI/HI thoughts voiced at this time.   Continue Lexapro 20 mg daily.   PHQ next encounter.   Restart BuSpar 5 mg TID PRN for breakthrough anxiety.     Vaginal itching  Consider atrophic vaginitis   Consider Estrace cream.  She has not had a hysterectomy.    Screening for HIV (human immunodeficiency virus)  Encounter for hepatitis C virus screening test for high risk patient  Discussed  HIV and HCV screening with patient in office.   Ordered with labs.     Screening for breast cancer  Mammogram ordered.     Return in about 3 months (around 11/09/2020), or if symptoms worsen or fail to improve, for In Person Visit, diabetes.Cherly Beach, MD  08/12/2020, 17:21  Electronically signed by Cherly Beach, MD    This note was partially generated using MModal Fluency Direct system, and there may be some incorrect words, spellings, and punctuation that were not noted in checking the note before saving.

## 2020-09-03 ENCOUNTER — Other Ambulatory Visit (HOSPITAL_BASED_OUTPATIENT_CLINIC_OR_DEPARTMENT_OTHER): Payer: Self-pay | Admitting: Family Medicine

## 2020-09-03 DIAGNOSIS — E119 Type 2 diabetes mellitus without complications: Secondary | ICD-10-CM

## 2020-09-03 DIAGNOSIS — R7309 Other abnormal glucose: Secondary | ICD-10-CM

## 2020-09-04 ENCOUNTER — Other Ambulatory Visit (HOSPITAL_BASED_OUTPATIENT_CLINIC_OR_DEPARTMENT_OTHER): Payer: Self-pay | Admitting: Family Medicine

## 2020-09-04 ENCOUNTER — Encounter (INDEPENDENT_AMBULATORY_CARE_PROVIDER_SITE_OTHER): Payer: Self-pay | Admitting: Family Medicine

## 2020-09-04 DIAGNOSIS — I10 Essential (primary) hypertension: Secondary | ICD-10-CM

## 2020-09-04 DIAGNOSIS — F419 Anxiety disorder, unspecified: Secondary | ICD-10-CM

## 2020-09-12 ENCOUNTER — Other Ambulatory Visit (HOSPITAL_BASED_OUTPATIENT_CLINIC_OR_DEPARTMENT_OTHER): Payer: Self-pay | Admitting: Family Medicine

## 2020-09-12 DIAGNOSIS — E119 Type 2 diabetes mellitus without complications: Secondary | ICD-10-CM

## 2020-09-30 ENCOUNTER — Telehealth (INDEPENDENT_AMBULATORY_CARE_PROVIDER_SITE_OTHER): Payer: Self-pay | Admitting: Family Medicine

## 2020-09-30 NOTE — Telephone Encounter (Signed)
Pt going to have labwork for A1c and was wondering if there is any lab test you need to order to check since she is on Trulicity.  If so please add that.  Sheryl Ovidio Kin  09/30/2020, 15:38

## 2020-10-01 NOTE — Telephone Encounter (Signed)
Patient stating she is having lab work for A1c (but I don't see any orders in her chart) and she is asking if she needs any other labs?     Last A1C was 03/31/20 with microalbumin/creat urine and Lipid panel.     Please advise. Mikayla D. York Cerise, RN  10/01/2020, 08:41

## 2020-10-02 ENCOUNTER — Other Ambulatory Visit (INDEPENDENT_AMBULATORY_CARE_PROVIDER_SITE_OTHER): Payer: Self-pay | Admitting: Family Medicine

## 2020-10-02 DIAGNOSIS — E119 Type 2 diabetes mellitus without complications: Secondary | ICD-10-CM

## 2020-10-02 NOTE — Telephone Encounter (Signed)
Went ahead and ordered a hemoglobin A1c.  No to microalbumin Lipitor yearly.  She had those completed on 03/31/2020.

## 2020-10-03 NOTE — Telephone Encounter (Signed)
My Chart message sent with recommendations. Mikayla D. York Cerise, RN  10/03/2020, 08:01

## 2020-10-07 ENCOUNTER — Other Ambulatory Visit (HOSPITAL_BASED_OUTPATIENT_CLINIC_OR_DEPARTMENT_OTHER): Payer: Self-pay | Admitting: Family Medicine

## 2020-10-07 DIAGNOSIS — R7309 Other abnormal glucose: Secondary | ICD-10-CM

## 2020-10-07 DIAGNOSIS — E119 Type 2 diabetes mellitus without complications: Secondary | ICD-10-CM

## 2020-10-13 ENCOUNTER — Telehealth (INDEPENDENT_AMBULATORY_CARE_PROVIDER_SITE_OTHER): Payer: Self-pay | Admitting: Family Medicine

## 2020-10-13 NOTE — Telephone Encounter (Signed)
-----   Message from [name] sent at 10/09/2020  5:15 PM EDT -----  Regarding: Recommendations Re: Labs  Want to be sure I can get the prescription for Trulicity called in tomorrow if the pharmacy needs one. I take the Trulicity on Saturdays.

## 2020-10-13 NOTE — Telephone Encounter (Signed)
I called the patient and spoke with her regarding this. I was out of the office the end of last week and her patient messages went directly to my inbasket. Patient stated all was taken care of with the Trulicity. Dose verified with her as well. Demetris Capell D. [place] Cerise, RN  10/13/2020, 08:14

## 2020-10-14 ENCOUNTER — Other Ambulatory Visit (INDEPENDENT_AMBULATORY_CARE_PROVIDER_SITE_OTHER): Payer: 59

## 2020-10-14 ENCOUNTER — Other Ambulatory Visit: Payer: Self-pay

## 2020-10-14 DIAGNOSIS — E119 Type 2 diabetes mellitus without complications: Secondary | ICD-10-CM

## 2020-10-14 LAB — HGA1C (HEMOGLOBIN A1C WITH EST AVG GLUCOSE)
ESTIMATED AVERAGE GLUCOSE: 169 mg/dL
HEMOGLOBIN A1C: 7.5 % — ABNORMAL HIGH (ref 4.3–6.1)

## 2020-10-14 NOTE — Ancillary Notes (Signed)
Department of Community [facility]     Venipuncture performed in office on RIGHT arm antecubital vein, dry pressure dressing was applied to site and patient tolerated it well.  Specimen was centrifuged, aliquoted as needed and specimen was labeled and packaged for transport.    Arrie Senate, PHLEBOTOMIST  10/14/2020, 09:59

## 2020-10-15 ENCOUNTER — Telehealth (INDEPENDENT_AMBULATORY_CARE_PROVIDER_SITE_OTHER): Payer: Self-pay | Admitting: Family Medicine

## 2020-10-15 NOTE — Result Encounter Note (Signed)
Please inform the patient that her hemoglobin A1c came back at 7.5%.  This is down from 9.6% 6 months ago.  This is very good news.  All of her efforts are pain off.    Encouraged her to keep up the good work.  I am making no changes to her plan of care.    Will review these labs together at her next appointment.    TY TH

## 2020-10-15 NOTE — Telephone Encounter (Signed)
-----   Message from Cherly Beach, MD sent at 10/15/2020  1:02 PM EDT -----  Please inform the patient that her hemoglobin A1c came back at 7.5%.  This is down from 9.6% 6 months ago.  This is very good news.  All of her efforts are pain off.    Encouraged her to keep up the good work.  I am making no changes to her plan of care.    Will review these labs together at her next appointment.    TY TH

## 2020-10-15 NOTE — Telephone Encounter (Signed)
Patient notified via phone of results and recommendations. Patient verbalized understanding with no further concerns at this time.

## 2020-10-17 ENCOUNTER — Other Ambulatory Visit (INDEPENDENT_AMBULATORY_CARE_PROVIDER_SITE_OTHER): Payer: Self-pay | Admitting: Family Medicine

## 2020-10-17 DIAGNOSIS — E119 Type 2 diabetes mellitus without complications: Secondary | ICD-10-CM

## 2020-11-01 ENCOUNTER — Encounter (INDEPENDENT_AMBULATORY_CARE_PROVIDER_SITE_OTHER): Payer: Self-pay | Admitting: Family Medicine

## 2020-11-01 ENCOUNTER — Other Ambulatory Visit (HOSPITAL_BASED_OUTPATIENT_CLINIC_OR_DEPARTMENT_OTHER): Payer: Self-pay | Admitting: Family Medicine

## 2020-11-01 DIAGNOSIS — E119 Type 2 diabetes mellitus without complications: Secondary | ICD-10-CM

## 2020-11-01 DIAGNOSIS — R7309 Other abnormal glucose: Secondary | ICD-10-CM

## 2020-11-05 ENCOUNTER — Telehealth (INDEPENDENT_AMBULATORY_CARE_PROVIDER_SITE_OTHER): Payer: Self-pay | Admitting: Family Medicine

## 2020-11-05 ENCOUNTER — Other Ambulatory Visit (INDEPENDENT_AMBULATORY_CARE_PROVIDER_SITE_OTHER): Payer: Self-pay | Admitting: Family Medicine

## 2020-11-05 DIAGNOSIS — E119 Type 2 diabetes mellitus without complications: Secondary | ICD-10-CM

## 2020-11-05 DIAGNOSIS — R7309 Other abnormal glucose: Secondary | ICD-10-CM

## 2020-11-05 MED ORDER — TRULICITY 3 MG/0.5 ML SUBCUTANEOUS PEN INJECTOR
PEN_INJECTOR | SUBCUTANEOUS | 2 refills | Status: DC
Start: 2020-11-05 — End: 2021-11-05

## 2020-11-05 NOTE — Telephone Encounter (Signed)
My Chart message sent to patient. She should have plenty of refills at pharmacy. Rx sent over on 11/03/20 for over a one month supply with 3 refills. Reese Senk D. [place] Cerise, RN  11/05/2020, 08:05

## 2020-11-05 NOTE — Telephone Encounter (Signed)
-----   Message from Mel Almond, [place] sent at 11/04/2020  3:48 PM EDT -----  Regarding: FW: Refill trulicty     ----- Message -----  From: Cherly Beach, MD  Sent: 11/04/2020   8:25 AM EDT  To: Mel Almond, MA  Subject: RE: Refill trulicty                              Please do so, TYTH      ----- Message -----  From: Mel Almond, MA  Sent: 11/03/2020   9:19 AM EDT  To: Cherly Beach, MD  Subject: FW: Refill trulicty                              Patient last seen on 08/12/20, last fill was on 09/03/20 with 5 refills, next appointment is on 11/20/20, can you advise?  Mel Almond, MA    ----- Message -----  From: Lawernce Ion  Sent: 11/01/2020   4:25 PM EDT  To: [facility]  Subject: Refill trulicty                                  Can you call in my refill for Trulicty, 3MG /0.5ML pen?    Thank you!  Margarita Grizzle

## 2020-11-20 ENCOUNTER — Encounter (INDEPENDENT_AMBULATORY_CARE_PROVIDER_SITE_OTHER): Payer: Self-pay | Admitting: Family Medicine

## 2020-11-29 ENCOUNTER — Other Ambulatory Visit (HOSPITAL_BASED_OUTPATIENT_CLINIC_OR_DEPARTMENT_OTHER): Payer: Self-pay | Admitting: Family Medicine

## 2020-11-29 DIAGNOSIS — K219 Gastro-esophageal reflux disease without esophagitis: Secondary | ICD-10-CM

## 2020-12-24 ENCOUNTER — Other Ambulatory Visit: Payer: Self-pay

## 2020-12-24 ENCOUNTER — Encounter (INDEPENDENT_AMBULATORY_CARE_PROVIDER_SITE_OTHER): Payer: Self-pay | Admitting: Family Medicine

## 2020-12-25 ENCOUNTER — Encounter (INDEPENDENT_AMBULATORY_CARE_PROVIDER_SITE_OTHER): Payer: Self-pay | Admitting: Family Medicine

## 2020-12-30 ENCOUNTER — Other Ambulatory Visit: Payer: Self-pay

## 2020-12-31 ENCOUNTER — Ambulatory Visit (INDEPENDENT_AMBULATORY_CARE_PROVIDER_SITE_OTHER): Payer: 59 | Attending: Family Medicine | Admitting: Family Medicine

## 2020-12-31 ENCOUNTER — Ambulatory Visit: Payer: 59 | Attending: Family Medicine

## 2020-12-31 ENCOUNTER — Encounter (INDEPENDENT_AMBULATORY_CARE_PROVIDER_SITE_OTHER): Payer: Self-pay | Admitting: Family Medicine

## 2020-12-31 VITALS — BP 124/82 | HR 77 | Resp 18 | Ht 62.0 in | Wt 201.0 lb

## 2020-12-31 DIAGNOSIS — Z1239 Encounter for other screening for malignant neoplasm of breast: Secondary | ICD-10-CM

## 2020-12-31 DIAGNOSIS — Z1212 Encounter for screening for malignant neoplasm of rectum: Secondary | ICD-10-CM

## 2020-12-31 DIAGNOSIS — F32A Depression, unspecified: Secondary | ICD-10-CM

## 2020-12-31 DIAGNOSIS — E119 Type 2 diabetes mellitus without complications: Secondary | ICD-10-CM | POA: Insufficient documentation

## 2020-12-31 DIAGNOSIS — Z1211 Encounter for screening for malignant neoplasm of colon: Secondary | ICD-10-CM

## 2020-12-31 DIAGNOSIS — I10 Essential (primary) hypertension: Secondary | ICD-10-CM

## 2020-12-31 DIAGNOSIS — F419 Anxiety disorder, unspecified: Secondary | ICD-10-CM

## 2020-12-31 DIAGNOSIS — K219 Gastro-esophageal reflux disease without esophagitis: Secondary | ICD-10-CM

## 2020-12-31 DIAGNOSIS — E785 Hyperlipidemia, unspecified: Secondary | ICD-10-CM

## 2020-12-31 LAB — COMPREHENSIVE METABOLIC PANEL, NON-FASTING
ALBUMIN: 4.1 g/dL (ref 3.5–5.0)
ALKALINE PHOSPHATASE: 72 U/L (ref 50–130)
ALT (SGPT): 18 U/L (ref 8–22)
ANION GAP: 11 mmol/L (ref 4–13)
AST (SGOT): 15 U/L (ref 8–45)
BILIRUBIN TOTAL: 0.4 mg/dL (ref 0.3–1.3)
BUN/CREA RATIO: 24 — ABNORMAL HIGH (ref 6–22)
BUN: 16 mg/dL (ref 8–25)
CALCIUM: 9.9 mg/dL (ref 8.5–10.0)
CHLORIDE: 103 mmol/L (ref 96–111)
CO2 TOTAL: 27 mmol/L (ref 22–30)
CREATININE: 0.68 mg/dL (ref 0.60–1.05)
ESTIMATED GFR: >90 mL/min/BSA (ref >=60)
GLUCOSE: 124 mg/dL (ref 65–125)
POTASSIUM: 4.3 mmol/L (ref 3.5–5.1)
PROTEIN TOTAL: 6.9 g/dL (ref 6.4–8.3)
SODIUM: 141 mmol/L (ref 136–145)

## 2020-12-31 LAB — CBC WITH DIFF
BASOPHIL #: <0.1 10*3/uL (ref <=0.20)
BASOPHIL %: 1 %
EOSINOPHIL #: <0.1 10*3/uL (ref <=0.50)
EOSINOPHIL %: 2 %
HCT: 41.4 % (ref 34.8–46.0)
HGB: 13.6 g/dL (ref 11.5–16.0)
IMMATURE GRANULOCYTE #: <0.1 10*3/uL (ref <0.10)
IMMATURE GRANULOCYTE %: 0 % (ref 0–1)
LYMPHOCYTE #: 1.49 10*3/uL (ref 1.00–4.80)
LYMPHOCYTE %: 37 %
MCH: 29.8 pg (ref 26.0–32.0)
MCHC: 32.9 g/dL (ref 31.0–35.5)
MCV: 90.8 fL (ref 78.0–100.0)
MONOCYTE #: 0.31 10*3/uL (ref 0.20–1.10)
MONOCYTE %: 8 %
MPV: 9.6 fL (ref 8.7–12.5)
NEUTROPHIL #: 2.08 10*3/uL (ref 1.50–7.70)
NEUTROPHIL %: 52 %
PLATELETS: 215 10*3/uL (ref 150–400)
RBC: 4.56 10*6/uL (ref 3.85–5.22)
RDW-CV: 12 % (ref 11.5–15.5)
WBC: 4 10*3/uL (ref 3.7–11.0)

## 2020-12-31 LAB — LIPID PANEL
CHOL/HDL RATIO: 4.2
CHOLESTEROL: 168 mg/dL (ref 100–200)
HDL CHOL: 40 mg/dL — ABNORMAL LOW (ref >=50)
LDL CALC: 97 mg/dL (ref <100)
NON-HDL: 128 mg/dL (ref <=190)
TRIGLYCERIDES: 156 mg/dL — ABNORMAL HIGH (ref <150)
VLDL CALC: 31 mg/dL — ABNORMAL HIGH (ref <30)

## 2020-12-31 LAB — MICROALBUMIN URINE, RANDOM
CREATININE RANDOM URINE: 161 mg/dL — ABNORMAL HIGH (ref 50–100)
MICROALBUMIN RANDOM URINE: 1 mg/dL
MICROALBUMIN/CREATININE: 0.006 mg/mg (ref <2.000)

## 2020-12-31 LAB — MICROALBUMIN/CREATININE RATIO, URINE, RANDOM
CREATININE RANDOM URINE: 161 mg/dL — ABNORMAL HIGH (ref 50–100)
MICROALBUMIN RANDOM URINE: 1 mg/dL
MICROALBUMIN/CREATININE RATIO RANDOM URINE: 6.2 mg/g (ref <30.0)

## 2020-12-31 MED ORDER — ESCITALOPRAM 10 MG TABLET
10.0000 mg | ORAL_TABLET | Freq: Every day | ORAL | 5 refills | Status: DC
Start: 2020-12-31 — End: 2021-05-25

## 2020-12-31 NOTE — Nursing Note (Signed)
12/31/20 0759   Depression Screen   Little interest or pleasure in doing things. 0   Feeling down, depressed, or hopeless 0   PHQ 2 Total 0

## 2020-12-31 NOTE — Progress Notes (Signed)
[place], [place]  [place]  [place] [address]  [phone]    Patient Name: Andrea Huerta  Date: 12/31/2020  MRN: [id number]  DOB: 11-Jan-1961    Chief complaint: The patient is a 60 y.o. old female who came in today for Diabetes and Covid-19 Positive     HPI:     Diabetes: Last HbA1C was  7.5%.  Patient denies symptoms of polydipsia, polyuria, hypoglycemic unawareness, , chest pain, dyspnea on exertion, diarrhea, foot ulcerations, paresthesias. Symptoms are not changed. Patient's home blood sugars are controlled. Patient is compliant with medications.  He is currently prescribed Trulicity, and metformin 500 mg t.i.d..   She does have multiple co-morbidities for ASCVD, including hyperlipidemia, obesity, and hypertension.  She has been working out at [employer].  She has had 15 lb weight loss in the last year.    ROS:  Review of systems completed with all positives and pertinent negatives as per HPI. Otherwise, negative.      Past medical history:  Past Medical History:   Diagnosis Date   . Diabetes mellitus, type 2 (CMS HCC)    . H/O complete eye exam 01/2016   . H/O mammogram 2017   . High cholesterol    . History of dental examination 04/2016   . Hypertension    . Sleep apnea    . Vaginal Pap smear 10/2014     Past surgical history:  Past Surgical History:   Procedure Laterality Date   . HX CESAREAN SECTION     . HX TONSILLECTOMY     . HX WISDOM TEETH EXTRACTION     . MOLE REMOVAL      several all normal     Medications:  Current Outpatient Medications   Medication Sig   . ASPIRIN (ASPIR-81 ORAL) Once a day   . Blood Sugar Diagnostic (FREESTYLE LITE STRIPS) Strip 1 Strip Twice daily   . busPIRone (BUSPAR) 5 mg Oral Tablet Take 1 Tab (5 mg total) by mouth Three times a day as needed (anxiety)   . dulaglutide (TRULICITY) 3 DE/0.8 mL Subcutaneous Pen Injector INJECT 0.5 ML (3 MG TOTAL) UNDER THE SKIN EVERY 7 DAYS   . escitalopram oxalate (LEXAPRO) 20 mg  Oral Tablet TAKE 1 TABLET BY MOUTH EVERY DAY   . flash glucose scanning reader (FREESTYLE LIBRE 14 DAY READER) Does not apply Misc For checking blood sugars 4-5 times daily.   . flash glucose sensor (FREESTYLE LIBRE 14 DAY SENSOR) Does not apply Kit For checking blood sugars 4-5 times daily. On 4 insulin injections daily.   Marland Kitchen GLUCOSAM/GLUC SU/AC-ALP-D-GLUC (GLUCOSAMINE COMPLEX ORAL) Once a day   . lisinopriL (PRINIVIL) 20 mg Oral Tablet TAKE 1 TABLET (20 MG TOTAL) BY MOUTH ONCE A DAY   . metFORMIN (GLUCOPHAGE) 500 mg Oral Tablet TAKE 1 TABLET (500 MG TOTAL) BY MOUTH THREE TIMES DAILY WITH MEALS   . MULTIVITAMIN (MULTIPLE VITAMINS ORAL) Once a day   . pantoprazole (PROTONIX) 40 mg Oral Tablet, Delayed Release (E.C.) TAKE 1 TABLET (40 MG TOTAL) BY MOUTH ONCE A DAY   . simvastatin (ZOCOR) 40 mg Oral Tablet TAKE 1 TABLET BY MOUTH EVERY DAY   . vitamin E 100 unit Oral Capsule Take 100 Units by mouth Once a day     Allergies:  No Known Allergies     Family history:  Family Medical History:     Problem Relation (Age of Onset)    Breast  Cancer Mother    Diabetes Mother, Maternal Grandfather    High Cholesterol Mother, Brother    Hypertension (High Blood Pressure) Mother, Father, Brother    Liver Cancer Father    Lymphoma Maternal Aunt    Melanoma Maternal Aunt    No Known Problems Sister, Maternal Grandmother, Paternal Grandmother, Paternal 54, Daughter, Son, Maternal Uncle, Paternal 19, Paternal Uncle, Other        Social history:  Social History     Socioeconomic History   . Marital status: Married   Occupational History   . Occupation: math Buyer, retail   Tobacco Use   . Smoking status: Never Smoker   . Smokeless tobacco: Never Used   Substance and Sexual Activity   . Alcohol use: Yes     Comment: 1 or 2 drinks per week       Vitals:    12/31/20 0758   BP: 124/82   Pulse: 77   Resp: 18   SpO2: 98%   Weight: 91.2 kg (201 lb)   Height: 1.575 m ($Remove'5\' 2"'gMFYPfC$ )   BMI: 36.84           Two year weight  curve.      Physical Examination:    GENERAL: Pt is a pleasant, well-nourished, well-developed 60 y.o. female who is in NAD. Appears stated age  5:  head normocephalic, symmetrical facies. EOM intact b/l. PERRLA. Sclera non-icteric, non-injected. No rhinorrhea. Oropharyngeal mucous membranes are not observed.  She is COVID positive.  She wears her mask during the exam.    NECK: supple. No masses, lymphadenopathy, JVD on exam. Trachea midline, no thyromegaly   CV: S1, S2. No murmurs, rubs, gallops.     LUNGS: CTAB, No rhonchi, rales, wheezes.   GI: (+) BS in all 4 quadrants. soft, NT/ND. No rigidity/ guarding/ rebound. No organomegaly/masses, or abdominal bruits  GU: deferred  MSK: Spontaneous normal AROM of major joints as observed during exam. No joint effusions/ swelling/ deformities.   No BLE edema, clubbing, or cyanosis.   2+ radial pulse present b/l.   + 2 reflexes Brachioradialis and patellar bilaterally.   Gait normal.  SKIN: No significant lesions, rashes, ecchymoses noted.  Multiple skin tags appreciated about the neckline bilaterally.  NEURO: AAOx4, CN grossly intact. Sensation intact b/l. No focal deficits.  PSYCH: Mood, behavior and affect normal w/ Intact judgement & insight   Exam as previous.       Diabetes Monitors  A1C: 7.5  A1C Date: 10/14/2020           Nephropathy Screening: On ACEI or ARB  Lab Results   Component Value Date    CHOLESTEROL 153 03/31/2020    HDLCHOL 42 (L) 03/31/2020    LDLCHOL 82 03/31/2020    TRIG 144 03/31/2020     Retinal Exam Date: 05/29/2018  Last diabetic foot exam: Not Found    Visit Diagnosis    Encounter Diagnoses   Name Primary?   . Type 2 diabetes mellitus without complication, without long-term current use of insulin (CMS HCC) Yes   . Encounter for colorectal cancer screening    . Gastroesophageal reflux disease, unspecified whether esophagitis present    . Essential hypertension    . Hyperlipidemia, unspecified hyperlipidemia type    . Anxiety and depression    .  Screening breast examination    . Anxiety      Orders Placed This Encounter   . MAMMO BILATERAL SCREENING-ADDL VIEWS/BREAST US AS REQ BY RAD   .  HGA1C (HEMOGLOBIN A1C WITH EST AVG GLUCOSE)   . CBC/DIFF   . COMPREHENSIVE METABOLIC PANEL, NON-FASTING   . Cologuard   . LIPID PANEL   . MICROALBUMIN/CREATININE RATIO, URINE, RANDOM   . HGA1C (HEMOGLOBIN A1C WITH EST AVG GLUCOSE)   . escitalopram oxalate (LEXAPRO) 10 mg Oral Tablet         Type 2 diabetes mellitus without complication, without long-term current use of insulin (CMS HCC)  Chronic, improved control  Down from 9.7 a year ago to 7.5% at last check.  She will be due for a new A1c.  Continue improved compliance with diabetic diet.  Continue decrease carbohydrate and sugar intake.   Continue structured exercise/ activity for weight loss.   Check blood sugars only as needed and for reference for her dietary changes.   I will prescribe her metformin 500 mg to take 3 times a day.    Continue Trulicity.  Will ramp up q.month.  X3 months.  Continue lisinopril 20 mg daily.   Continue simvastatin 40 mg daily.   Will need foot exam.   Patient follows with ophthalmologist annually.     Essential hypertension  Chronic, now controlled.  Encouraged lifestyle modifications.   Continue behavioral efforts, esp  Cardiac friendly diet,   < 2 gm salt q day.   Exercise 20 min q day most days of the week.  Continue ACEI - lisinopril 20 mg daily.   Labs ordered.     Anxiety and depression  Chronic, controlled with medication.   No SI/HI thoughts voiced at this time.   Will reduce Lexapro to 10 mg daily.   PHQ next encounter.   Restart BuSpar 5 mg TID PRN for breakthrough anxiety.     Vaginal itching  Consider atrophic vaginitis   Consider Estrace cream.  Her last Pap was 03/31/2020.  She has not had a hysterectomy.    Screening for HIV (human immunodeficiency virus)  Encounter for hepatitis C virus screening test for high risk patient  Discussed HIV and HCV screening with patient in  office.   Ordered with labs.     Screening for breast cancer  Mammogram ordered.     Return in about 3 months (around 04/02/2021), or if symptoms worsen or fail to improve, for In Person Visit, DM, HTN, GERD, GAD.    Cherly Beach, MD  12/31/2020, 08:18  Electronically signed by Cherly Beach, MD    This note was partially generated using MModal Fluency Direct system, and there may be some incorrect words, spellings, and punctuation that were not noted in checking the note before saving.

## 2021-01-01 LAB — HGA1C (HEMOGLOBIN A1C WITH EST AVG GLUCOSE): HEMOGLOBIN A1C: 6.6 % — ABNORMAL HIGH (ref <5.7)

## 2021-01-01 NOTE — Result Encounter Note (Signed)
Please inform the patient that her labs are back.    Her hemoglobin A1c is 6.6% this is the best it has been in 4 years.  Her urine show she is not spilling significant amounts of protein.    Her lipid panel shows her total cholesterol and her bad cholesterol are both near goal.    Her metabolic panel showed normal electrolyte levels, good kidney function, and good liver function.    Her blood count shows no sign of anemia, no sign of infection, and her platelet count was normal.    A making no changes to her plan of care.  Encouraged her to keep up the great work.    As always will review these labs together at her next appointment.    TY TH

## 2021-01-02 ENCOUNTER — Other Ambulatory Visit: Payer: Self-pay

## 2021-01-02 ENCOUNTER — Encounter (HOSPITAL_BASED_OUTPATIENT_CLINIC_OR_DEPARTMENT_OTHER): Payer: Self-pay

## 2021-01-02 ENCOUNTER — Inpatient Hospital Stay
Admission: RE | Admit: 2021-01-02 | Discharge: 2021-01-02 | Disposition: A | Discharge status: Home / Self Care | Payer: 59 | Source: Ambulatory Visit | Attending: Family Medicine | Admitting: Family Medicine

## 2021-01-02 DIAGNOSIS — Z1239 Encounter for other screening for malignant neoplasm of breast: Secondary | ICD-10-CM

## 2021-01-02 DIAGNOSIS — Z1231 Encounter for screening mammogram for malignant neoplasm of breast: Secondary | ICD-10-CM | POA: Insufficient documentation

## 2021-01-02 NOTE — Result Encounter Note (Signed)
Please inform the patient that her mammogram showed no worrisome mass or lesion.    The radiologist recommended a repeat mammogram in 1 year.    Will review this together at our next appointment.    TY TH

## 2021-01-07 ENCOUNTER — Other Ambulatory Visit (INDEPENDENT_AMBULATORY_CARE_PROVIDER_SITE_OTHER): Payer: Self-pay

## 2021-01-16 LAB — FECAL DNA TESTING (AMB): FECAL DNA TEST (AMB): NEGATIVE

## 2021-01-16 LAB — COLOGUARD® COLON CANCER SCREEN: COLOGUARD RESULT: NEGATIVE

## 2021-01-16 NOTE — Result Encounter Note (Signed)
Please inform the pt her Cologuard testing was negative. [place]

## 2021-01-22 ENCOUNTER — Other Ambulatory Visit (INDEPENDENT_AMBULATORY_CARE_PROVIDER_SITE_OTHER): Payer: Self-pay | Admitting: Family Medicine

## 2021-01-22 DIAGNOSIS — F419 Anxiety disorder, unspecified: Secondary | ICD-10-CM

## 2021-03-14 ENCOUNTER — Other Ambulatory Visit (HOSPITAL_BASED_OUTPATIENT_CLINIC_OR_DEPARTMENT_OTHER): Payer: Self-pay | Admitting: Family Medicine

## 2021-03-14 DIAGNOSIS — I10 Essential (primary) hypertension: Secondary | ICD-10-CM

## 2021-05-25 ENCOUNTER — Other Ambulatory Visit: Payer: Self-pay

## 2021-05-25 ENCOUNTER — Ambulatory Visit (INDEPENDENT_AMBULATORY_CARE_PROVIDER_SITE_OTHER): Payer: 59 | Admitting: Family

## 2021-05-25 ENCOUNTER — Encounter (INDEPENDENT_AMBULATORY_CARE_PROVIDER_SITE_OTHER): Payer: Self-pay | Admitting: Family

## 2021-05-25 VITALS — BP 122/82 | HR 97 | Temp 96.9°F | Resp 16 | Ht 62.0 in | Wt 207.0 lb

## 2021-05-25 DIAGNOSIS — H6121 Impacted cerumen, right ear: Secondary | ICD-10-CM

## 2021-05-25 DIAGNOSIS — J309 Allergic rhinitis, unspecified: Secondary | ICD-10-CM

## 2021-05-25 DIAGNOSIS — Z6837 Body mass index (BMI) 37.0-37.9, adult: Secondary | ICD-10-CM

## 2021-05-25 MED ORDER — AZELASTINE 137 MCG (0.1 %) NASAL SPRAY
1.0000 | Freq: Two times a day (BID) | NASAL | 1 refills | Status: DC
Start: 2021-05-25 — End: 2022-12-13

## 2021-05-25 MED ORDER — FLUTICASONE PROPIONATE 50 MCG/ACTUATION NASAL SPRAY,SUSPENSION
2.0000 | Freq: Two times a day (BID) | NASAL | 1 refills | Status: DC
Start: 2021-05-25 — End: 2021-07-02

## 2021-05-25 MED ORDER — LORATADINE 10 MG TABLET
10.0000 mg | ORAL_TABLET | Freq: Every day | ORAL | 1 refills | Status: DC
Start: 2021-05-25 — End: 2021-07-27

## 2021-05-25 NOTE — Progress Notes (Signed)
PRIMARY CARE, MID- [facility]  [place] [address]    Progress Note    Name: Andrea Huerta MRN:  [id number]   Date: 05/25/2021 Age: 60 y.o.       PCP: Cherly Beach, MD    Reason for Visit: Andrea Huerta is a 60 y.o. female who is being seen today for Ear Problem(s) (Pt reports she has been to [facility] x2, has been on 2 rounds of antibiotics with no relief. Pt c/o left ear having fluid and the right ear is red. Pt c/o continued sinus drainage as well, fatigue. Pt is still taking Claritin and Flonase twice a day.)      History of Present Illness: She is here today for 2 [facility] follow ups. She presented to [place] on 11/1 and 11/17 with complaints of left ear pain and redness. She states she has been on 2 different antibiotics without relief. She was given amoxicillin and clindamycin without relief. She has also been taking Claritin and Flonase.She presents today with no change in condition. Symptoms include left ear fluid and right ear redness. Denies fever, sinus pain, cough, congestion. She does report sneezing, drainage, and fatigue. OTC treatments include Claritin and flonase. 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         Past Medical History:   Diagnosis Date    Diabetes mellitus, type 2 (CMS HCC)     H/O complete eye exam 01/2016    H/O mammogram 2017    High cholesterol     History of dental examination 04/2016    Hypertension     Sleep apnea     Vaginal Pap smear 10/2014         Past Surgical History:   Procedure Laterality Date    HX CESAREAN SECTION      HX TONSILLECTOMY      HX WISDOM TEETH EXTRACTION      MOLE REMOVAL      several all normal         Family Medical History:     Problem Relation (Age of Onset)    Breast Cancer Mother    Diabetes Mother, Maternal Grandfather    High Cholesterol Mother, Brother    Hypertension (High Blood Pressure) Mother, Father, Brother    Liver Cancer Father     Lymphoma Maternal Aunt    Melanoma Maternal Aunt    No Known Problems Sister, Maternal Grandmother, Paternal Grandmother, Paternal Grandfather, Daughter, Son, Maternal Uncle, Paternal Aunt, Paternal Uncle, Other           Social History     Tobacco Use    Smoking status: Never    Smokeless tobacco: Never   Substance Use Topics    Alcohol use: Yes     Comment: 1 or 2 drinks per week        Medication:  ASPIRIN (ASPIR-81 ORAL), Once a day  Blood Sugar Diagnostic (FREESTYLE LITE STRIPS) Strip, 1 Strip Twice daily  busPIRone (BUSPAR) 5 mg Oral Tablet, Take 1 Tab (5 mg total) by mouth Three times a day as needed (anxiety)  dulaglutide (TRULICITY) 3 FY/1.0 mL Subcutaneous Pen Injector, INJECT 0.5 ML (3 MG TOTAL) UNDER THE SKIN EVERY 7 DAYS  escitalopram oxalate (LEXAPRO) 20 mg Oral Tablet, Take 1 Tablet (20 mg total) by mouth Once a day  flash glucose scanning reader (FREESTYLE LIBRE 14 DAY READER) Does not  apply Misc, For checking blood sugars 4-5 times daily.  flash glucose sensor (FREESTYLE LIBRE 14 DAY SENSOR) Does not apply Kit, For checking blood sugars 4-5 times daily. On 4 insulin injections daily.  GLUCOSAM/GLUC SU/AC-ALP-D-GLUC (GLUCOSAMINE COMPLEX ORAL), Once a day  lisinopriL (PRINIVIL) 20 mg Oral Tablet, TAKE 1 TABLET BY MOUTH ONCE A DAY  metFORMIN (GLUCOPHAGE) 500 mg Oral Tablet, TAKE 1 TABLET (500 MG TOTAL) BY MOUTH THREE TIMES DAILY WITH MEALS  MULTIVITAMIN (MULTIPLE VITAMINS ORAL), Once a day  pantoprazole (PROTONIX) 40 mg Oral Tablet, Delayed Release (E.C.), TAKE 1 TABLET (40 MG TOTAL) BY MOUTH ONCE A DAY  simvastatin (ZOCOR) 40 mg Oral Tablet, TAKE 1 TABLET BY MOUTH EVERY DAY  vitamin E 100 unit Oral Capsule, Take 100 Units by mouth Once a day  escitalopram oxalate (LEXAPRO) 10 mg Oral Tablet, Take 1 Tablet (10 mg total) by mouth Once a day  fluticasone propionate (FLONASE) 50 mcg/actuation Nasal Spray, Suspension, SPRAY 2 SPRAYS IN EACH NOSTRIL DAILY AS NEEDED  loratadine (CLARITIN) 10 mg Oral  Tablet, Take 1 Tablet (10 mg total) by mouth Once a day    No facility-administered medications prior to visit.    Allergies:  No Known Allergies    Review of Systems  Review of Systems   Constitutional: Negative for chills, fever and malaise/fatigue.   HENT: Positive for ear pain. Negative for congestion and sore throat.    Eyes: Negative for pain.   Cardiovascular: Negative for chest pain and palpitations.   Respiratory: Negative for cough and shortness of breath.    Skin: Negative for itching and rash.   Musculoskeletal: Negative for falls and joint pain.   Gastrointestinal: Negative for abdominal pain, change in bowel habit, nausea and vomiting.   Neurological: Negative for headaches.   Psychiatric/Behavioral: Negative for depression. The patient is not nervous/anxious.         Physical Exam:  Vitals:    05/25/21 0936   BP: 122/82   Pulse: 97   Resp: 16   Temp: 36.1 C (96.9 F)   SpO2: 97%   Weight: 93.9 kg (207 lb)   Height: 1.575 m ('5\' 2"' )   BMI: 37.94      Physical Exam  Vitals and nursing note reviewed.   Constitutional:       General: She is not in acute distress.     Appearance: Normal appearance. She is not ill-appearing.   HENT:      Head: Normocephalic and atraumatic.      Right Ear: There is impacted cerumen (1 small piece of wax in right ear near ear drum).      Left Ear: Tympanic membrane normal.      Nose: No signs of injury, laceration, nasal tenderness or mucosal edema.      Right Nostril: No epistaxis or septal hematoma.      Left Nostril: No epistaxis or septal hematoma.      Right Turbinates: Not swollen.      Left Turbinates: Not swollen.      Right Sinus: No maxillary sinus tenderness or frontal sinus tenderness.      Left Sinus: No maxillary sinus tenderness or frontal sinus tenderness.      Mouth/Throat:      Mouth: Mucous membranes are moist. No injury.      Tongue: No lesions.      Palate: No lesions.      Pharynx: No oropharyngeal exudate or posterior oropharyngeal erythema.   Eyes:  General:         Right eye: No discharge.         Left eye: No discharge.      Conjunctiva/sclera: Conjunctivae normal.   Cardiovascular:      Rate and Rhythm: Normal rate and regular rhythm.      Heart sounds: No murmur heard.  Pulmonary:      Effort: Pulmonary effort is normal. No respiratory distress.      Breath sounds: Normal breath sounds. No wheezing.   Abdominal:      General: Bowel sounds are normal. There is no distension.      Palpations: Abdomen is soft.      Tenderness: There is no abdominal tenderness.   Musculoskeletal:         General: Normal range of motion.      Cervical back: No tenderness.   Lymphadenopathy:      Cervical: No cervical adenopathy.   Skin:     General: Skin is warm and dry.      Findings: No erythema or rash.   Neurological:      General: No focal deficit present.      Mental Status: She is alert. Mental status is at baseline.   Psychiatric:         Mood and Affect: Mood normal.         Behavior: Behavior normal.         Assessment/Plan:    ICD-10-CM    1. Allergic rhinitis   Acute. Suspect she has allergies or eustachian tube dysfunction. She will try OTC astelin and Claritin, and Flonase. Follow up in 2 weeks if not improving. Offered ENT referral and was declined for now. She will let office know if needing ENT. J30.9 azelastine (ASTELIN) 137 mcg (0.1 %) Nasal Aerosol, Spray     loratadine (CLARITIN) 10 mg Oral Tablet     fluticasone propionate (FLONASE) 50 mcg/actuation Nasal Spray, Suspension      2. Impacted cerumen of right ear  H61.21       3. BMI 37.0-37.9, adult  Z68.37         Nursing Notes:   Leonarda Salon, RN  05/25/21 1055  Signed  right ear was flushed with warm tap water and peroxide mixture per order of Pricilla Loveless APRN.  Leonarda Salon, RN        Follow up: Return for as scheduled or sooner if needed.  Seek medical attention for new or worsening symptoms.     Pricilla Loveless, APRN      This note was partially created using MModal Fluency Direct system (voice  recognition software) and is inherently subject to errors including those of syntax and "sound-alike" substitutions which may escape proofreading.  In such instances, original meaning may be extrapolated by contextual derivation.

## 2021-05-25 NOTE — Nursing Note (Signed)
right ear was flushed with warm tap water and peroxide mixture per order of [employer] APRN.  Leonarda Salon, RN

## 2021-06-16 ENCOUNTER — Other Ambulatory Visit (INDEPENDENT_AMBULATORY_CARE_PROVIDER_SITE_OTHER): Payer: Self-pay | Admitting: Family

## 2021-06-16 DIAGNOSIS — J309 Allergic rhinitis, unspecified: Secondary | ICD-10-CM

## 2021-07-01 ENCOUNTER — Other Ambulatory Visit (HOSPITAL_BASED_OUTPATIENT_CLINIC_OR_DEPARTMENT_OTHER): Payer: Self-pay | Admitting: Family Medicine

## 2021-07-01 DIAGNOSIS — I10 Essential (primary) hypertension: Secondary | ICD-10-CM

## 2021-07-01 NOTE — Telephone Encounter (Signed)
Last scheduled appointment with you was 12/31/20  Currently scheduled future appointment is 07/15/21      Rochele Pages, LPN  5/49/8264, 15:83

## 2021-07-02 ENCOUNTER — Other Ambulatory Visit (INDEPENDENT_AMBULATORY_CARE_PROVIDER_SITE_OTHER): Payer: Self-pay | Admitting: Family

## 2021-07-02 DIAGNOSIS — J309 Allergic rhinitis, unspecified: Secondary | ICD-10-CM

## 2021-07-02 NOTE — Telephone Encounter (Signed)
Last scheduled appointment with you was 05/25/2021.  Currently scheduled future appointment is 07/15/21      Rochele Pages, LPN  2/78/7183, 67:25

## 2021-07-03 ENCOUNTER — Other Ambulatory Visit (INDEPENDENT_AMBULATORY_CARE_PROVIDER_SITE_OTHER): Payer: Self-pay | Admitting: Family Medicine

## 2021-07-03 DIAGNOSIS — J309 Allergic rhinitis, unspecified: Secondary | ICD-10-CM

## 2021-07-03 MED ORDER — FLUTICASONE PROPIONATE 50 MCG/ACTUATION NASAL SPRAY,SUSPENSION
2.0000 | Freq: Two times a day (BID) | NASAL | 3 refills | Status: AC
Start: 2021-07-03 — End: 2021-10-01

## 2021-07-03 NOTE — Telephone Encounter (Signed)
Medication correction per pharmacy request for 90 day supply d/t insurance     [place], [place]  0/02/3817, 29:93

## 2021-07-14 ENCOUNTER — Other Ambulatory Visit: Payer: Self-pay

## 2021-07-15 ENCOUNTER — Ambulatory Visit (INDEPENDENT_AMBULATORY_CARE_PROVIDER_SITE_OTHER): Payer: 59 | Admitting: Family Medicine

## 2021-07-15 ENCOUNTER — Encounter (INDEPENDENT_AMBULATORY_CARE_PROVIDER_SITE_OTHER): Payer: Self-pay | Admitting: Family Medicine

## 2021-07-15 VITALS — BP 128/68 | HR 86 | Temp 97.5°F | Resp 17 | Ht 62.0 in | Wt 214.0 lb

## 2021-07-15 DIAGNOSIS — F32A Depression, unspecified: Secondary | ICD-10-CM

## 2021-07-15 DIAGNOSIS — B078 Other viral warts: Secondary | ICD-10-CM

## 2021-07-15 DIAGNOSIS — H6121 Impacted cerumen, right ear: Secondary | ICD-10-CM

## 2021-07-15 DIAGNOSIS — F419 Anxiety disorder, unspecified: Secondary | ICD-10-CM

## 2021-07-15 DIAGNOSIS — I10 Essential (primary) hypertension: Secondary | ICD-10-CM

## 2021-07-15 DIAGNOSIS — E119 Type 2 diabetes mellitus without complications: Secondary | ICD-10-CM

## 2021-07-15 MED ORDER — OLIVE OIL
2.0000 [drp] | TOPICAL_OIL | Freq: Two times a day (BID) | 1 refills | Status: AC
Start: 2021-07-15 — End: 2021-07-22

## 2021-07-15 NOTE — Progress Notes (Signed)
FAMILY MEDICINE, [facility] PRIMARY CARE  [place] [address]       Name: Andrea Huerta MRN:  [id number]   Date: 07/15/2021 Age: 61 y.o.     Chief complaint: The patient is a 61 y.o. old female who came in today for diabetes mellitus management    HPI:     Diabetes: Last HbA1C was 6.6% last fall 2022, this was down from greater than 7% last spring.  Patient denies symptoms of polydipsia, polyuria, hypoglycemic unawareness, chest pain, dyspnea on exertion, diarrhea, foot ulcerations, paresthesias. Symptoms are not changed. Patient's home blood sugars are controlled. Patient is compliant with medications.  She is currently prescribed Trulicity, and metformin 500 mg t.i.d..   She does have multiple co-morbidities for ASCVD, including hyperlipidemia, obesity, and hypertension.  She endorses less dietary discretion over the holidays.  She has had a slight weight gain as result.  She has not been able to exercise secondary to the weather.    Symptomatic concerns:  Patient has had about of otitis media since I saw her last.  She has antibiotic therapy x2.  She now has resultant year pain and fatigue.  We discuss the fatigue in terms of post acute illness syndrome.  She has a sense of fullness still about the right ear and she would like the ear examined today.  Finally she has concern of new growth about the right lateral foot just proximal to the 5th digit.    ROS:  Review of systems completed with all positives and pertinent negatives as per HPI. Otherwise, negative.      Past medical history:  Past Medical History:   Diagnosis Date    Diabetes mellitus, type 2 (CMS [place])     H/O complete eye exam 01/2016    H/O mammogram 2017    High cholesterol     History of dental examination 04/2016    Hypertension     Sleep apnea     Vaginal Pap smear 10/2014     Past surgical history:  Past Surgical History:   Procedure Laterality Date    HX CESAREAN SECTION      HX TONSILLECTOMY       HX WISDOM TEETH EXTRACTION      MOLE REMOVAL      several all normal     Medications:  Current Outpatient Medications   Medication Sig    ASPIRIN (ASPIR-81 ORAL) Once a day    azelastine (ASTELIN) 137 mcg (0.1 %) Nasal Aerosol, Spray Administer 1 Spray into each nostril Twice daily Use in each nostril as directed    Blood Sugar Diagnostic (FREESTYLE LITE STRIPS) Strip 1 Strip Twice daily    busPIRone (BUSPAR) 5 mg Oral Tablet Take 1 Tab (5 mg total) by mouth Three times a day as needed (anxiety)    dulaglutide (TRULICITY) 3 OE/7.0 mL Subcutaneous Pen Injector INJECT 0.5 ML (3 MG TOTAL) UNDER THE SKIN EVERY 7 DAYS    escitalopram oxalate (LEXAPRO) 20 mg Oral Tablet Take 1 Tablet (20 mg total) by mouth Once a day    flash glucose scanning reader (FREESTYLE LIBRE 14 DAY READER) Does not apply Misc For checking blood sugars 4-5 times daily.    flash glucose sensor (FREESTYLE LIBRE 14 DAY SENSOR) Does not apply Kit For checking blood sugars 4-5 times daily. On 4 insulin injections daily.    fluticasone propionate (FLONASE) 50 mcg/actuation Nasal Spray, Suspension Administer 2 Sprays into each nostril Twice daily  for 90 days    GLUCOSAM/GLUC SU/AC-ALP-D-GLUC (GLUCOSAMINE COMPLEX ORAL) Once a day    lisinopriL (PRINIVIL) 20 mg Oral Tablet TAKE 1 TABLET BY MOUTH ONCE A DAY    loratadine (CLARITIN) 10 mg Oral Tablet Take 1 Tablet (10 mg total) by mouth Once a day    metFORMIN (GLUCOPHAGE) 500 mg Oral Tablet TAKE 1 TABLET (500 MG TOTAL) BY MOUTH THREE TIMES DAILY WITH MEALS    MULTIVITAMIN (MULTIPLE VITAMINS ORAL) Once a day    Olive Oil (SWEET OIL) Oil 2 Drops Twice daily for 7 days To right ear    pantoprazole (PROTONIX) 40 mg Oral Tablet, Delayed Release (E.C.) TAKE 1 TABLET (40 MG TOTAL) BY MOUTH ONCE A DAY    simvastatin (ZOCOR) 40 mg Oral Tablet TAKE 1 TABLET BY MOUTH EVERY DAY    vitamin E 100 unit Oral Capsule Take 1 Capsule (100 Units total) by mouth Once a day     Allergies:  No Known Allergies      Family history:  Family Medical History:     Problem Relation (Age of Onset)    Breast Cancer Mother    Diabetes Mother, Maternal Grandfather    High Cholesterol Mother, Brother    Hypertension (High Blood Pressure) Mother, Father, Brother    Liver Cancer Father    Lymphoma Maternal Aunt    Melanoma Maternal Aunt    No Known Problems Sister, Maternal Grandmother, Paternal Grandmother, Paternal Grandfather, Daughter, Son, Maternal Uncle, Paternal 63, Paternal Uncle, Other        Social history:  Social History     Socioeconomic History    Marital status: Married   Occupational History    Occupation: math Pharmacist, 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      Physical Examination:    GENERAL: Pt is a pleasant, well-nourished, well-developed 61 y.o. female who is in NAD. Appears stated age  72:  head normocephalic, symmetrical facies. EOM intact b/l. PERRLA. Sclera non-icteric, non-injected. No rhinorrhea. Oropharyngeal mucous membranes are pink and moist.  Her left tympanic membrane is observed has normal light reflex no swelling or bulging no tenderness.  No significant cerumen.  The right ear has scant dry wax obscuring complete visualization of the tympanic membrane.  The visualized portions tympanic membrane is superior and posterior and is pearly gray in color without obvious distension.  NECK: supple. No masses, lymphadenopathy, JVD on exam. Trachea midline, no thyromegaly   CV: S1, S2. No murmurs, rubs, gallops.     LUNGS: CTAB, No rhonchi, rales, wheezes.   GI: (+) BS in all 4 quadrants. soft, NT/ND. No rigidity/ guarding/ rebound. No organomegaly/masses, or abdominal  bruits  MSK: Spontaneous normal AROM of major joints as observed during exam. No joint effusions/ swelling/ deformities.   No BLE edema, clubbing, or cyanosis.   2+ radial pulse present b/l.   + 2 reflexes Brachioradialis and patellar bilaterally.  Gait normal.  SKIN: No significant lesions, rashes, ecchymoses noted.  Multiple skin tags appreciated about the neckline bilaterally.  Patient with for less than 2 mm round firm lesions that appear to be limited to the dermal layer of the skin over the right lower extremity 5th metatarsal carpal joint.  NEURO: AAOx4, CN grossly intact. Sensation intact b/l. No focal deficits.  PSYCH: Mood, behavior and affect normal w/ Intact judgement & insight   Exam as previous.     Diabetes Monitors  A1C: 6.6  A1C Date: 12/31/2020           Nephropathy Screening: On ACEI or ARB  Lab Results   Component Value Date    CHOLESTEROL 168 12/31/2020    HDLCHOL 40 (L) 12/31/2020    LDLCHOL 97 12/31/2020    TRIG 156 (H) 12/31/2020     Retinal Exam Date: 06/27/2020  Last diabetic foot exam: Not Found    Visit Diagnosis    Encounter Diagnoses   Name Primary?    Type 2 diabetes mellitus without complication, without long-term current use of insulin (CMS HCC) Yes    Impacted cerumen of right ear     Flat wart      Orders Placed This Encounter    HGA1C (HEMOGLOBIN A1C WITH EST AVG GLUCOSE)    Refer to [facility] ([facility]) Podiatry, POA    Olive Oil (SWEET OIL) Oil       Type 2 diabetes mellitus without complication, without long-term current use of insulin (CMS HCC)  Chronic, improved control  Continues to trend down from 9.7 to less than 7 at last encounter.    She will be due for a new A1c.  Continue improved compliance with diabetic diet.  Continue decrease carbohydrate and sugar intake.   Continue structured exercise/ activity for weight loss.   Check blood sugars only as needed and for reference for her dietary changes.   Continue metformin 500 mg to take 3 times a day.    Continue Trulicity.   Will ramp up q.month.  X3 months.  Continue lisinopril 20 mg daily.   Continue simvastatin 40 mg daily.   Will need foot exam. -she is being referred to Podiatry.  (patient has flat warts or early corns developing as well)  Patient follows with ophthalmologist annually.     Essential hypertension  Chronic, now controlled.  Encouraged lifestyle modifications.   Continue behavioral efforts, esp  Cardiac friendly diet,   < 2 gm salt q day.   Exercise 20 min q day most days of the week.  Continue ACEI - lisinopril 20 mg daily.   Labs ordered.     Anxiety and depression  Chronic, controlled with medication.   No SI/HI thoughts voiced at this time.   Did not tolerate reduction in Lexapro to 10 mg  Continue Lexapro 20 mg.  PHQ next encounter.   Continue BuSpar 5 mg TID PRN for breakthrough anxiety.     Vaginal itching-improved.   Consider atrophic vaginitis   Consider Estrace cream.  Her last Pap was 03/31/2020.  She has not had a hysterectomy.    Otitis media right side:  Resolved.    Continues to have cerumen with debris.    Recommend sweet oil.    Will monitor.      Screening for HIV (human immunodeficiency virus)  Encounter for hepatitis C virus screening test for high risk patient  Labs previously ordered.  Discussed HIV and HCV screening with patient in office.  Ordered with labs.      Screening for breast cancer  Mammogram ordered.     Return in about 3 months (around 10/13/2021), or if symptoms worsen or fail to improve, for In Person Visit, diabetes mellitus.    Cherly Beach, MD  07/15/2021, 16:16  Electronically signed by Cherly Beach, MD    This note was partially generated using MModal Fluency Direct system, and there may be some incorrect words, spellings, and punctuation that were not noted in checking the note before saving.

## 2021-07-15 NOTE — Nursing Note (Signed)
07/15/21 1459   PHQ 9 (follow up)   Little interest or pleasure in doing things. 1   Feeling down, depressed, or hopeless 1   PHQ 2 Total 2   Trouble falling or staying asleep, or sleeping too much. 0   Feeling tired or having little energy 0   Poor appetite or overeating 0   Feeling bad about yourself/ that you are a failure in the past 2 weeks? 0   Trouble concentrating on things in the past 2 weeks? 0   Moving/Speaking slowly or being fidgety or restless  in the past 2 weeks? 0   Thoughts that you would be better off DEAD, or of hurting yourself in some way. 0   If you checked off any problems, how difficult have these problems made it for you to do your work, take care of things at home, or get along with other people? Somewhat difficult   PHQ 9 Total 2   Interpretation of Total Score No depression     Andrea Lamm, LPN  0/92/9574, 73:40

## 2021-07-16 ENCOUNTER — Encounter (INDEPENDENT_AMBULATORY_CARE_PROVIDER_SITE_OTHER): Payer: Self-pay

## 2021-07-16 DIAGNOSIS — G4733 Obstructive sleep apnea (adult) (pediatric): Secondary | ICD-10-CM | POA: Insufficient documentation

## 2021-07-25 ENCOUNTER — Other Ambulatory Visit (HOSPITAL_BASED_OUTPATIENT_CLINIC_OR_DEPARTMENT_OTHER): Payer: Self-pay | Admitting: Family Medicine

## 2021-07-25 ENCOUNTER — Other Ambulatory Visit (INDEPENDENT_AMBULATORY_CARE_PROVIDER_SITE_OTHER): Payer: Self-pay | Admitting: Family

## 2021-07-25 DIAGNOSIS — E119 Type 2 diabetes mellitus without complications: Secondary | ICD-10-CM

## 2021-07-25 DIAGNOSIS — K219 Gastro-esophageal reflux disease without esophagitis: Secondary | ICD-10-CM

## 2021-07-25 DIAGNOSIS — J309 Allergic rhinitis, unspecified: Secondary | ICD-10-CM

## 2021-07-25 DIAGNOSIS — F419 Anxiety disorder, unspecified: Secondary | ICD-10-CM

## 2021-08-03 ENCOUNTER — Encounter (INDEPENDENT_AMBULATORY_CARE_PROVIDER_SITE_OTHER): Payer: Self-pay | Admitting: Family Medicine

## 2021-08-05 ENCOUNTER — Other Ambulatory Visit (INDEPENDENT_AMBULATORY_CARE_PROVIDER_SITE_OTHER): Payer: Self-pay | Admitting: Family Medicine

## 2021-08-05 ENCOUNTER — Telehealth (INDEPENDENT_AMBULATORY_CARE_PROVIDER_SITE_OTHER): Payer: Self-pay | Admitting: Family Medicine

## 2021-08-05 DIAGNOSIS — Z9989 Dependence on other enabling machines and devices: Secondary | ICD-10-CM

## 2021-08-05 NOTE — Telephone Encounter (Signed)
-----   Message from [name] sent at 08/03/2021  4:02 PM EST -----  Regarding: cpap  Contact: [phone]  Found message. Called number and ext. Left message

## 2021-08-05 NOTE — Telephone Encounter (Signed)
Patient returned call.   Completed Epworth Sleepiness Scale questions.   Faxing order to [facility] for in-home sleep study if indicated.       Rochele Pages, LPN  08/08/2881, 37:44

## 2021-08-17 ENCOUNTER — Other Ambulatory Visit: Payer: Self-pay

## 2021-08-17 ENCOUNTER — Encounter (INDEPENDENT_AMBULATORY_CARE_PROVIDER_SITE_OTHER): Payer: Self-pay | Admitting: Family Medicine

## 2021-08-17 ENCOUNTER — Other Ambulatory Visit (INDEPENDENT_AMBULATORY_CARE_PROVIDER_SITE_OTHER): Payer: 59

## 2021-08-17 DIAGNOSIS — E119 Type 2 diabetes mellitus without complications: Secondary | ICD-10-CM

## 2021-08-17 LAB — HGA1C (HEMOGLOBIN A1C WITH EST AVG GLUCOSE)
ESTIMATED AVERAGE GLUCOSE: 148 mg/dL
HEMOGLOBIN A1C: 6.8 % — ABNORMAL HIGH (ref 4.3–6.1)

## 2021-08-17 NOTE — Result Encounter Note (Signed)
Please inform the pt that her Hgb A1C increased every so slightly to 6.8%.  This level shows good diabetic control.  I'm making no change in her plan of care.  We will review this together at our next encounter.     [facility]

## 2021-08-17 NOTE — Ancillary Notes (Signed)
Department of Community Practice     Venipuncture performed in office on right  arm antecubital vein, dry pressure dressing was applied to site and patient tolerated it well.  Specimen was centrifuged, aliquoted as needed and specimen was labeled and packaged for transport.    Leeann Must  08/17/2021, 08:40

## 2021-08-17 NOTE — Telephone Encounter (Signed)
From: Lawernce Ion  To: Cherly Beach, MD  Sent: 08/17/2021 7:52 AM EST  Subject: A1C    [facility], will they have my order for an A1C?

## 2021-08-29 ENCOUNTER — Encounter (INDEPENDENT_AMBULATORY_CARE_PROVIDER_SITE_OTHER): Payer: Self-pay | Admitting: Family Medicine

## 2021-09-10 ENCOUNTER — Ambulatory Visit (INDEPENDENT_AMBULATORY_CARE_PROVIDER_SITE_OTHER): Payer: Self-pay | Admitting: Foot & Ankle Surgery

## 2021-09-14 ENCOUNTER — Encounter (INDEPENDENT_AMBULATORY_CARE_PROVIDER_SITE_OTHER): Payer: Self-pay | Admitting: Foot & Ankle Surgery

## 2021-09-14 ENCOUNTER — Ambulatory Visit (INDEPENDENT_AMBULATORY_CARE_PROVIDER_SITE_OTHER): Payer: 59 | Admitting: Foot & Ankle Surgery

## 2021-09-14 ENCOUNTER — Other Ambulatory Visit: Payer: Self-pay

## 2021-09-14 VITALS — Ht 62.0 in | Wt 214.0 lb

## 2021-09-14 DIAGNOSIS — B079 Viral wart, unspecified: Secondary | ICD-10-CM

## 2021-09-14 DIAGNOSIS — B070 Plantar wart: Secondary | ICD-10-CM

## 2021-09-14 DIAGNOSIS — B078 Other viral warts: Secondary | ICD-10-CM

## 2021-09-14 MED ORDER — IMIQUIMOD 5 % TOPICAL CREAM PACKET
1.0000 | TOPICAL_CREAM | CUTANEOUS | 0 refills | Status: DC
Start: 2021-09-14 — End: 2021-09-30

## 2021-09-14 NOTE — Procedures (Signed)
[facility]  [address]  [place] [address]    Procedure Note    Name: Kenyatte Gruber MRN:  [id number]   Date: 09/14/2021 Age: 61 y.o.       17110 - DESTRUCTION OF BENIGN LESIONS OTHER THAN SKIN TAGS/CUTANEOUS VASCULAR PROLIFERATIVE LESIONS; UP TO 14 LESIONS (AMB ONLY)  Performed by: Bartholome Bill, DPM  Authorized by: Bartholome Bill, DPM     Documentation:        Injection(s)/Procedure(s):  Patient verified by:  Name and birthdate  Site of the procedure confirmed: Yes  Site: Right plantar 5th MPJ.  Time out performed.    Sharp debridement was performed to right foot verruca utilizing 15 blade to level of pinpoint bleeding. Hemostasis was obtained with pressure. CryoTherapy was then applied overlying. This was 1 application of CryoTherapy. Bandage was then applied overlying. Patient tolerated procedure well and without complication. Home going instructions were dispensed to patient.         Bartholome Bill, DPM

## 2021-09-14 NOTE — Progress Notes (Signed)
[facility]  [place]  [place] [address]       Name: Andrea Huerta MRN:  [id number]   Date: 09/14/2021 Age: 61 y.o.     PCP: Cherly Beach, MD    Chief Complaint: New Patient (New patient flat wart of right foot. Patient states that she has had the wart for a while. Has tried OTC wart remover and duct tape. Denies pain/)      HPI: Andrea Huerta is a 61 y.o. female presenting for wart check. [name] reports that she has had the lesion for some time now and states that she has attempted to apply OTC wart removal kits as well as duct tape. Patient denies any pain at this time and presents FWB wearing tennis shoes today.  Patient states that the nail salon noticed that she had these lesions, patient states likely she picked this up at the nail salon, patient also states that she feels like she has an ingrown on the right great toe lateral nail border, states does not hurt at this time however sometimes feels pain there    Review of Systems:  Constitutional: No fever, malaise, or weight loss.  Skin: Wart care. No rashes, lesions, itching.  HENT: No frequent or significant headaches.   Cardio: No chest pain, palpitations, or leg swelling.   Respiratory: No cough, wheezing, SOB.  GI: No abdominal pain.  GU: No dysuria, hematuria, polyuria.  MSK: No back pain.  Neuro: No numbness, tingling, or burning in feet.   All other systems reviewed and are negative, unless commented on in the HPI.      Past Medical History:  Past Medical History:   Diagnosis Date   . Diabetes mellitus, type 2 (CMS HCC)    . H/O complete eye exam 01/2016   . H/O mammogram 2017   . High cholesterol    . History of dental examination 04/2016   . Hypertension    . Sleep apnea    . Vaginal Pap smear 10/2014           Past Surgical History:   Past Surgical History:   Procedure Laterality Date   . Hx cesarean section     . Hx tonsillectomy     . Hx wisdom teeth extraction     . Mole removal          Allergies:  No Known Allergies    Medications:  Current Outpatient Medications   Medication Sig   . ASPIRIN (ASPIR-81 ORAL) Once a day   . azelastine (ASTELIN) 137 mcg (0.1 %) Nasal Aerosol, Spray Administer 1 Spray into each nostril Twice daily Use in each nostril as directed   . Blood Sugar Diagnostic (FREESTYLE LITE STRIPS) Strip 1 Strip Twice daily   . busPIRone (BUSPAR) 5 mg Oral Tablet Take 1 Tab (5 mg total) by mouth Three times a day as needed (anxiety)   . dulaglutide (TRULICITY) 3 FI/4.3 mL Subcutaneous Pen Injector INJECT 0.5 ML (3 MG TOTAL) UNDER THE SKIN EVERY 7 DAYS   . escitalopram oxalate (LEXAPRO) 20 mg Oral Tablet TAKE 1 TABLET BY MOUTH EVERY DAY   . flash glucose scanning reader (FREESTYLE LIBRE 14 DAY READER) Does not apply Misc For checking blood sugars 4-5 times daily.   . flash glucose sensor (FREESTYLE LIBRE 14 DAY SENSOR) Does not apply Kit For checking blood sugars 4-5 times daily. On 4 insulin injections daily.   . fluticasone propionate (FLONASE) 50 mcg/actuation Nasal  Spray, Suspension Administer 2 Sprays into each nostril Twice daily for 90 days   . GLUCOSAM/GLUC SU/AC-ALP-D-GLUC (GLUCOSAMINE COMPLEX ORAL) Once a day   . imiquimod (ALDARA) 5 % Cream in Packet Apply 1 Packet topically Every Monday, Wednesday and Friday for 30 days apply a thin layer as directed Indications: Verruca vulgaris   . lisinopriL (PRINIVIL) 20 mg Oral Tablet TAKE 1 TABLET BY MOUTH ONCE A DAY   . loratadine (CLARITIN) 10 mg Oral Tablet TAKE 1 TABLET BY MOUTH ONCE A DAY   . metFORMIN (GLUCOPHAGE) 500 mg Oral Tablet TAKE 1 TABLET (500 MG TOTAL) BY MOUTH THREE TIMES DAILY WITH MEALS   . MULTIVITAMIN (MULTIPLE VITAMINS ORAL) Once a day (Patient not taking: Reported on 09/14/2021)   . pantoprazole (PROTONIX) 40 mg Oral Tablet, Delayed Release (E.C.) TAKE 1 TABLET BY MOUTH ONCE A DAY   . prenatal vitamin-iron-folate Tablet Take 1 Tablet by mouth Once a day   . simvastatin (ZOCOR) 40 mg Oral Tablet TAKE 1 TABLET BY  MOUTH EVERY DAY   . vitamin E 100 unit Oral Capsule Take 1 Capsule (100 Units total) by mouth Once a day       Family History:  Family Medical History:     Problem Relation (Age of Onset)    Breast Cancer Mother    Diabetes Mother, Maternal Grandfather    High Cholesterol Mother, Brother    Hypertension (High Blood Pressure) Mother, Father, Brother    Liver Cancer Father    Lymphoma Maternal Aunt    Melanoma Maternal Aunt    No Known Problems Sister, Maternal Grandmother, Paternal Grandmother, Paternal 49, Daughter, Son, Maternal Uncle, Paternal 79, Paternal Uncle, Other            Social History:  Social History     Socioeconomic History   . Marital status: Married   Occupational History   . Occupation: math Buyer, retail   Tobacco Use   . Smoking status: Never   . Smokeless tobacco: Never   Substance and Sexual Activity   . Alcohol use: Yes     Comment: 1 or 2 drinks per week       Physical Exam:  Patient is a 61 y.o. female who appears well developed, well nourished and with good attention to hygiene and body habitus.   Ht 1.575 m (_0 )   Wt 97.1 kg (214 lb)   LMP  (LMP Unknown)   BMI 39.14 kg/m       Body mass index is 39.14 kg/m.  Vascular:   Dorsalis pedis and posterior tibial pulses palpable bilaterally.  Capillary Fill time < 3 seconds to digits 1-5 bilaterally.  Skin temperature warm to warm tibial tuberosity to the digits bilaterally.  Hair growth present.  No edema.   No varicosities.    Neurological:   Intact vibratory sensation at the hallux IPJ bilaterally.  Intact protective sensation b/l via SWMF.    Dermatological:   Skin appears well hydrated and supple.   Good color, texture, turgor.   Verruca lesion noted to the right plantar 5th MPJ.   No callosities present.   Webspaces clean and dry 1-4 bilaterally.   Nails 1-5 bilaterally appear normal.    Skin appears well hydrated and supple. Good color, texture, turgor. No open lesions present. Verruciform lesion noted to the right  plantar 5th MPJ (+) thrombosed capillaries and disruption of skin lines. Lesion measures 0.6 cm in diameter.  Pain with lateral compression of the lesion.  Musculoskeletal/Orthopaedic:   Structurally within normal limits.  +5/5 muscle strength Dorsiflexion, Plantarflexion, Inversion, Eversion bilaterally.  ROM of the 1st MTP joint is full bilaterally.    ROM of the MTJ/STJ is full without pain or crepitus bilaterally.    Ankle joint ROM is decreased bilaterally.    Encounter Medications and Orders:  Orders Placed This Encounter   . 17110 - DESTRUCTION OF BENIGN LESIONS OTHER THAN SKIN TAGS/CUTANEOUS VASCULAR PROLIFERATIVE LESIONS; UP TO 14 LESIONS (AMB ONLY)   . imiquimod (ALDARA) 5 % Cream in Packet       Imaging:  None today.       Assessment:    ICD-10-CM    1. Plantar wart of right foot  B07.0 17110 - DESTRUCTION OF BENIGN LESIONS OTHER THAN SKIN TAGS/CUTANEOUS VASCULAR PROLIFERATIVE LESIONS; UP TO 14 LESIONS (AMB ONLY)      2. Flat wart  B07.8       3. Verruca vulgaris  B07.9 17110 - DESTRUCTION OF BENIGN LESIONS OTHER THAN SKIN TAGS/CUTANEOUS VASCULAR PROLIFERATIVE LESIONS; UP TO 14 LESIONS (AMB ONLY)           Plan:   A comprehensive history and physical examination were preformed. The patient was educated on clinical and radiographic findings, diagnosis and treatment plans. Patient state that she understands all that has been explained and all questions were answered to her apparent satisfaction.     We discussed the options for treatment. We discussed the nature of verrucas, that they can be resistant to treatment and that recurrence can occur. We discussed the risks, benefits, and alternatives. Discussed the risk of scarring and pain with treatment.     Patient elects to continue with cryotherapy treatment. Patient was educated on details of the procedure as well as medically reasonable risks, benefits and complications. Verbal consent was obtained. Please see procedure note below.     Prescribed  Aldara.     Follow up in two weeks for wart care.     Injection(s)/Procedure(s):  Patient verified by:  Name and birthdate  Site of the procedure confirmed: Yes  Site: Right plantar 5th MPJ.  Time out performed.    Sharp debridement was performed to right foot verruca utilizing 15 blade to level of pinpoint bleeding. Hemostasis was obtained with pressure. CryoTherapy was then applied overlying. This was 1 application of CryoTherapy. Bandage was then applied overlying. Patient tolerated procedure well and without complication. Home going instructions were dispensed to patient.     Follow-up:  Return in about 2 weeks (around 09/28/2021) for [facility].    I am scribing for, and in the presence of, Dr. Bartholome Bill for services provided on 09/14/2021.  Grace Isaac, SCRIBE   [place], [place]  09/14/2021, 14:20    I personally performed the services described in this documentation, as scribed  in my presence, and it is both accurate  and complete.    Bartholome Bill, DPM  Bartholome Bill, DPM  09/14/2021, 18:56

## 2021-09-15 ENCOUNTER — Telehealth (INDEPENDENT_AMBULATORY_CARE_PROVIDER_SITE_OTHER): Payer: Self-pay | Admitting: Foot & Ankle Surgery

## 2021-09-15 ENCOUNTER — Encounter (INDEPENDENT_AMBULATORY_CARE_PROVIDER_SITE_OTHER): Payer: Self-pay

## 2021-09-15 NOTE — Telephone Encounter (Signed)
Please advise 

## 2021-09-15 NOTE — Telephone Encounter (Signed)
-----   Message from [place], [place] sent at 09/15/2021  8:40 AM EDT -----  Regarding: Medicine for foot  Contact: [phone]  Please advise.       ----- Message -----  From: Lawernce Ion  Sent: 09/15/2021   8:11 AM EDT  To: Marguerita Beards, MA  Subject: Medicine for foot                                I thought a prescription was to be called in for my foot?  I had 2 spots frozen off my foot yesterday.     Margarita Grizzle

## 2021-09-15 NOTE — Telephone Encounter (Signed)
I called in Aldara to [facility] on [address] yesterday maybe they do not have the prescription ready yet, if the patient does not get it today patient to call back I can try to call it somewhere else if they do not have the prescription

## 2021-09-15 NOTE — Telephone Encounter (Signed)
Notified via [place].   Tish Frederickson, LPN  1/46/0479, 98:72

## 2021-09-29 ENCOUNTER — Other Ambulatory Visit: Payer: Self-pay

## 2021-09-29 ENCOUNTER — Ambulatory Visit (INDEPENDENT_AMBULATORY_CARE_PROVIDER_SITE_OTHER): Payer: 59 | Admitting: Foot & Ankle Surgery

## 2021-09-29 ENCOUNTER — Encounter (INDEPENDENT_AMBULATORY_CARE_PROVIDER_SITE_OTHER): Payer: Self-pay | Admitting: Foot & Ankle Surgery

## 2021-09-29 VITALS — BP 130/90 | Ht 62.0 in | Wt 210.0 lb

## 2021-09-29 DIAGNOSIS — B070 Plantar wart: Secondary | ICD-10-CM

## 2021-09-29 DIAGNOSIS — B079 Viral wart, unspecified: Secondary | ICD-10-CM

## 2021-09-29 NOTE — Progress Notes (Signed)
[facility]  [place]  [place] [address]       Name: Andrea Huerta MRN:  [id number]   Date: 09/29/2021 Age: 61 y.o.     PCP: Cherly Beach, MD    Chief Complaint: Wart Removal (Patient is here for wart care on right foot. /Pain level is 0/10./FWB in tennis shoes. /Patient wants to confirm correct usage of the ointment. )      HPI: Andrea Huerta is a 61 y.o. female presenting for wart care. Andrea Huerta denies any pain at this time and presents FWB wearing tennis shoes. Patient reports she would like to like to ensure that she is applying the correct ointment to her feet.     Review of Systems:  Constitutional: No fever, malaise, or weight loss.  Skin: Wart care. No rashes, lesions, itching.  HENT: No frequent or significant headaches.   Cardio: No chest pain, palpitations, or leg swelling.   Respiratory: No cough, wheezing, SOB.  GI: No abdominal pain.  GU: No dysuria, hematuria, polyuria.  MSK: No back pain.  Neuro: No numbness, tingling, or burning in feet.   All other systems reviewed and are negative, unless commented on in the HPI.      Past Medical History:  Past Medical History:   Diagnosis Date   . Diabetes mellitus, type 2 (CMS HCC)    . H/O complete eye exam 01/2016   . H/O mammogram 2017   . High cholesterol    . History of dental examination 04/2016   . Hypertension    . Sleep apnea    . Vaginal Pap smear 10/2014           Past Surgical History:   Past Surgical History:   Procedure Laterality Date   . Hx cesarean section     . Hx tonsillectomy     . Hx wisdom teeth extraction     . Mole removal         Allergies:  No Known Allergies    Medications:  Current Outpatient Medications   Medication Sig   . ASPIRIN (ASPIR-81 ORAL) Once a day   . azelastine (ASTELIN) 137 mcg (0.1 %) Nasal Aerosol, Spray Administer 1 Spray into each nostril Twice daily Use in each nostril as directed   . Blood Sugar Diagnostic (FREESTYLE LITE STRIPS) Strip 1 Strip Twice  daily   . busPIRone (BUSPAR) 5 mg Oral Tablet Take 1 Tab (5 mg total) by mouth Three times a day as needed (anxiety)   . dulaglutide (TRULICITY) 3 SV/7.7 mL Subcutaneous Pen Injector INJECT 0.5 ML (3 MG TOTAL) UNDER THE SKIN EVERY 7 DAYS   . escitalopram oxalate (LEXAPRO) 20 mg Oral Tablet TAKE 1 TABLET BY MOUTH EVERY DAY   . flash glucose scanning reader (FREESTYLE LIBRE 14 DAY READER) Does not apply Misc For checking blood sugars 4-5 times daily.   . flash glucose sensor (FREESTYLE LIBRE 14 DAY SENSOR) Does not apply Kit For checking blood sugars 4-5 times daily. On 4 insulin injections daily.   Marland Kitchen GLUCOSAM/GLUC SU/AC-ALP-D-GLUC (GLUCOSAMINE COMPLEX ORAL) Once a day   . imiquimod (ALDARA) 5 % Cream in Packet Apply 1 Packet topically Every Monday, Wednesday and Friday for 30 days apply a thin layer as directed Indications: Verruca vulgaris   . lisinopriL (PRINIVIL) 20 mg Oral Tablet TAKE 1 TABLET BY MOUTH ONCE A DAY   . loratadine (CLARITIN) 10 mg Oral Tablet TAKE 1 TABLET BY MOUTH ONCE A DAY   .  metFORMIN (GLUCOPHAGE) 500 mg Oral Tablet TAKE 1 TABLET (500 MG TOTAL) BY MOUTH THREE TIMES DAILY WITH MEALS   . MULTIVITAMIN (MULTIPLE VITAMINS ORAL) Once a day (Patient not taking: Reported on 09/14/2021)   . pantoprazole (PROTONIX) 40 mg Oral Tablet, Delayed Release (E.C.) TAKE 1 TABLET BY MOUTH ONCE A DAY (Patient not taking: Reported on 09/29/2021)   . prenatal vitamin-iron-folate Tablet Take 1 Tablet by mouth Once a day   . simvastatin (ZOCOR) 40 mg Oral Tablet TAKE 1 TABLET BY MOUTH EVERY DAY   . vitamin E 100 unit Oral Capsule Take 1 Capsule (100 Units total) by mouth Once a day       Family History:  Family Medical History:     Problem Relation (Age of Onset)    Breast Cancer Mother    Diabetes Mother, Maternal Grandfather    High Cholesterol Mother, Brother    Hypertension (High Blood Pressure) Mother, Father, Brother    Liver Cancer Father    Lymphoma Maternal Aunt    Melanoma Maternal Aunt    No Known Problems  Sister, Maternal Grandmother, Paternal Grandmother, Paternal 53, Daughter, Son, Maternal Uncle, Paternal 25, Paternal Uncle, Other            Social History:  Social History     Socioeconomic History   . Marital status: Married   Occupational History   . Occupation: math Buyer, retail   Tobacco Use   . Smoking status: Never   . Smokeless tobacco: Never   Substance and Sexual Activity   . Alcohol use: Yes     Comment: 1 or 2 drinks per week       Physical Exam:  Patient is a 61 y.o. female who appears well developed, well nourished and with good attention to hygiene and body habitus.   BP (!) 130/90   Ht 1.575 m ('5\' 2"' )   Wt 95.3 kg (210 lb)   LMP  (LMP Unknown)   BMI 38.41 kg/m       Body mass index is 38.41 kg/m.  Vascular:   Dorsalis pedis and posterior tibial pulses palpable bilaterally.  Capillary Fill time < 3 seconds to digits 1-5 bilaterally.  Skin temperature warm to warm tibial tuberosity to the digits bilaterally.  Hair growth present.  No edema.   No varicosities.    Neurological:   Intact vibratory sensation at the hallux IPJ bilaterally.  Intact protective sensation b/l via SWMF.    Dermatological:   Skin appears well hydrated and supple.   Good color, texture, turgor.   Verruca lesion noted to the right plantar 5th MPJ.   No callosities present.   Webspaces clean and dry 1-4 bilaterally.   Nails 1-5 bilaterally appear normal.    Skin appears well hydrated and supple. Good color, texture, turgor. No open lesions present. Verruciform lesion noted to the right plantar 5th MPJ (+) thrombosed capillaries and disruption of skin lines. Lesion measures 0.6 cm in diameter.  Pain with lateral compression of the lesion.    Musculoskeletal/Orthopaedic:   Structurally within normal limits.  +5/5 muscle strength Dorsiflexion, Plantarflexion, Inversion, Eversion bilaterally.  ROM of the 1st MTP joint is full bilaterally.    ROM of the MTJ/STJ is full without pain or crepitus bilaterally.     Ankle joint ROM is decreased bilaterally.    Encounter Medications and Orders:  Orders Placed This Encounter   . 17110 - DESTRUCTION OF BENIGN LESIONS OTHER THAN SKIN TAGS/CUTANEOUS VASCULAR PROLIFERATIVE LESIONS; UP TO  14 LESIONS (AMB ONLY)   . imiquimod (ALDARA) 5 % Cream in Packet       Imaging:  None today.       Assessment:    ICD-10-CM    1. Plantar wart of right foot  B07.0 17110 - DESTRUCTION OF BENIGN LESIONS OTHER THAN SKIN TAGS/CUTANEOUS VASCULAR PROLIFERATIVE LESIONS; UP TO 14 LESIONS (AMB ONLY)      2. Verruca vulgaris  B07.9            Plan:   A comprehensive history and physical examination were preformed. The patient was educated on clinical and radiographic findings, diagnosis and treatment plans. Patient state that she understands all that has been explained and all questions were answered to her apparent satisfaction.     Patient elects to continue with cryotherapy treatment. Patient was educated on details of the procedure as well as medically reasonable risks, benefits and complications. Verbal consent was obtained. Please see procedure note below.     Continue applying Aldara.     Prescribed Aldara.     Follow up in two weeks for wart care.      Injection(s)/Procedure(s):  Patient verified by:  Name and birthdate  Site of the procedure confirmed: Yes  Site: Right plantar 5th MPJ.  Time out performed.    Sharp debridement was performed to right foot verruca utilizing 15 blade to level of pinpoint bleeding. Hemostasis was obtained with pressure. CryoTherapy was then applied overlying. This was 1 application of CryoTherapy. Bandage was then applied overlying. Patient tolerated procedure well and without complication. Home going instructions were dispensed to patient.     Follow-up:  Return in about 2 weeks (around 10/13/2021) for [facility].    I am scribing for, and in the presence of, Dr. Bartholome Bill for services provided on 09/29/2021.  Grace Isaac, SCRIBE   Grace Isaac, SCRIBE   09/29/2021, 15:55    I personally performed the services described in this documentation, as scribed  in my presence, and it is both accurate  and complete.    Bartholome Bill, DPM  Bartholome Bill, DPM  10/02/2021, 22:58

## 2021-09-30 MED ORDER — IMIQUIMOD 5 % TOPICAL CREAM PACKET
1.0000 | TOPICAL_CREAM | CUTANEOUS | 0 refills | Status: AC
Start: 2021-09-30 — End: 2021-10-30

## 2021-10-02 ENCOUNTER — Encounter (INDEPENDENT_AMBULATORY_CARE_PROVIDER_SITE_OTHER): Payer: Self-pay | Admitting: Foot & Ankle Surgery

## 2021-10-02 NOTE — Procedures (Signed)
[facility]  [place]  [place] [address]    Procedure Note    Name: Andrea Huerta MRN:  [id number]   Date: 09/29/2021 Age: 61 y.o.       17110 - DESTRUCTION OF BENIGN LESIONS OTHER THAN SKIN TAGS/CUTANEOUS VASCULAR PROLIFERATIVE LESIONS; UP TO 14 LESIONS (AMB ONLY)  Performed by: Bartholome Bill, DPM  Authorized by: Bartholome Bill, DPM     Documentation:      Injection(s)/Procedure(s):  Patient verified by:  Name and birthdate  Site of the procedure confirmed: Yes  Site: Right plantar 5th MPJ.  Time out performed.    Sharp debridement was performed to right foot verruca utilizing 15 blade to level of pinpoint bleeding. Hemostasis was obtained with pressure. CryoTherapy was then applied overlying. This was 1 application of CryoTherapy. Bandage was then applied overlying. Patient tolerated procedure well and without complication. Home going instructions were dispensed to patient.         Bartholome Bill, DPM

## 2021-10-13 ENCOUNTER — Other Ambulatory Visit: Payer: Self-pay

## 2021-10-14 ENCOUNTER — Encounter (INDEPENDENT_AMBULATORY_CARE_PROVIDER_SITE_OTHER): Payer: Self-pay | Admitting: Family Medicine

## 2021-10-22 ENCOUNTER — Encounter (INDEPENDENT_AMBULATORY_CARE_PROVIDER_SITE_OTHER): Payer: Self-pay | Admitting: Foot & Ankle Surgery

## 2021-10-26 ENCOUNTER — Ambulatory Visit (INDEPENDENT_AMBULATORY_CARE_PROVIDER_SITE_OTHER): Payer: 59 | Admitting: Foot & Ankle Surgery

## 2021-10-26 ENCOUNTER — Encounter (INDEPENDENT_AMBULATORY_CARE_PROVIDER_SITE_OTHER): Payer: Self-pay | Admitting: Foot & Ankle Surgery

## 2021-10-26 ENCOUNTER — Other Ambulatory Visit: Payer: Self-pay

## 2021-10-26 VITALS — BP 118/80 | Ht 62.0 in | Wt 210.0 lb

## 2021-10-26 DIAGNOSIS — B070 Plantar wart: Secondary | ICD-10-CM

## 2021-10-26 DIAGNOSIS — T148XXA Other injury of unspecified body region, initial encounter: Secondary | ICD-10-CM

## 2021-10-26 NOTE — Progress Notes (Signed)
[facility]  [address]  [place] [address]       Name: Andrea Huerta MRN:  [id number]   Date: 10/26/2021 Age: 61 y.o.     PCP: Cherly Beach, MD    Chief Complaint: Plantar's Wart (Wart care right foot. FWB in flip flops. Patient denies any pain. )      HPI: Andrea Huerta is a 61 y.o. female presenting for plantar's wart check. Andrea Huerta presents FWB wearing flip flops and denies any pain at this time. Patient reports that she has been doing okay and does not report any additional pedal complaints at this time.     Review of Systems:  Constitutional: No fever, malaise, or weight loss.  Skin: Plantar's wart check. No rashes, lesions, itching.  HENT: No frequent or significant headaches.   Cardio: No chest pain, palpitations, or leg swelling.   Respiratory: No cough, wheezing, SOB.  GI: No abdominal pain.  GU: No dysuria, hematuria, polyuria.  MSK: No back pain.  Neuro: No numbness, tingling, or burning in feet.   All other systems reviewed and are negative, unless commented on in the HPI.      Past Medical History:  Past Medical History:   Diagnosis Date   . Diabetes mellitus, type 2 (CMS HCC)    . H/O complete eye exam 01/2016   . H/O mammogram 2017   . High cholesterol    . History of dental examination 04/2016   . Hypertension    . Sleep apnea    . Vaginal Pap smear 10/2014           Past Surgical History:   Past Surgical History:   Procedure Laterality Date   . Hx cesarean section     . Hx tonsillectomy     . Hx wisdom teeth extraction     . Mole removal         Allergies:  No Known Allergies    Medications:  Current Outpatient Medications   Medication Sig   . ASPIRIN (ASPIR-81 ORAL) Once a day   . azelastine (ASTELIN) 137 mcg (0.1 %) Nasal Aerosol, Spray Administer 1 Spray into each nostril Twice daily Use in each nostril as directed   . Blood Sugar Diagnostic (FREESTYLE LITE STRIPS) Strip 1 Strip Twice daily   . busPIRone (BUSPAR) 5 mg Oral Tablet Take  1 Tab (5 mg total) by mouth Three times a day as needed (anxiety)   . dulaglutide (TRULICITY) 3 JQ/4.9 mL Subcutaneous Pen Injector INJECT 0.5 ML (3 MG TOTAL) UNDER THE SKIN EVERY 7 DAYS   . escitalopram oxalate (LEXAPRO) 20 mg Oral Tablet TAKE 1 TABLET BY MOUTH EVERY DAY   . flash glucose scanning reader (FREESTYLE LIBRE 14 DAY READER) Does not apply Misc For checking blood sugars 4-5 times daily.   . flash glucose sensor (FREESTYLE LIBRE 14 DAY SENSOR) Does not apply Kit For checking blood sugars 4-5 times daily. On 4 insulin injections daily.   Marland Kitchen GLUCOSAM/GLUC SU/AC-ALP-D-GLUC (GLUCOSAMINE COMPLEX ORAL) Once a day   . imiquimod (ALDARA) 5 % Cream in Packet Apply 1 Packet topically Every Monday, Wednesday and Friday for 30 days apply a thin layer as directed Indications: Verruca vulgaris   . lisinopriL (PRINIVIL) 20 mg Oral Tablet TAKE 1 TABLET BY MOUTH ONCE A DAY   . loratadine (CLARITIN) 10 mg Oral Tablet TAKE 1 TABLET BY MOUTH ONCE A DAY   . metFORMIN (GLUCOPHAGE) 500 mg Oral Tablet TAKE 1  TABLET (500 MG TOTAL) BY MOUTH THREE TIMES DAILY WITH MEALS   . MULTIVITAMIN (MULTIPLE VITAMINS ORAL) Once a day   . pantoprazole (PROTONIX) 40 mg Oral Tablet, Delayed Release (E.C.) TAKE 1 TABLET BY MOUTH ONCE A DAY   . prenatal vitamin-iron-folate Tablet Take 1 Tablet by mouth Once a day   . simvastatin (ZOCOR) 40 mg Oral Tablet TAKE 1 TABLET BY MOUTH EVERY DAY   . vitamin E 100 unit Oral Capsule Take 1 Capsule (100 Units total) by mouth Once a day       Family History:  Family Medical History:     Problem Relation (Age of Onset)    Breast Cancer Mother    Diabetes Mother, Maternal Grandfather    High Cholesterol Mother, Brother    Hypertension (High Blood Pressure) Mother, Father, Brother    Liver Cancer Father    Lymphoma Maternal Aunt    Melanoma Maternal Aunt    No Known Problems Sister, Maternal Grandmother, Paternal Grandmother, Paternal 42, Daughter, Son, Maternal Uncle, Paternal 13, Paternal Uncle, Other             Social History:  Social History     Socioeconomic History   . Marital status: Married   Occupational History   . Occupation: math Buyer, retail   Tobacco Use   . Smoking status: Never   . Smokeless tobacco: Never   Substance and Sexual Activity   . Alcohol use: Yes     Comment: 1 or 2 drinks per week       Physical Exam:  Patient is a 61 y.o. female who appears well developed, well nourished and with good attention to hygiene and body habitus.   BP 118/80 (Site: Right, Patient Position: Sitting)   Ht 1.575 m (_0 )   Wt 95.3 kg (210 lb)   LMP  (LMP Unknown)   BMI 38.41 kg/m       Body mass index is 38.41 kg/m.  Vascular:   Dorsalis pedis and posterior tibial pulses palpable bilaterally.  Capillary Fill time < 3 seconds to digits 1-5 bilaterally.  Skin temperature warm to warm tibial tuberosity to the digits bilaterally.  Hair growth present.  No edema.   No varicosities.    Neurological:   Intact vibratory sensation at the hallux IPJ bilaterally.  Intact protective sensation b/l via SWMF.    Dermatological:   Skin appears well hydrated and supple.   Good color, texture, turgor.  Verrucalesion noted to the right plantar 5th MPJ.   No callosities present.   Webspaces clean and dry 1-4 bilaterally.   Nails 1-5 bilaterally appear normal.    Skin appears well hydrated and supple. Good color, texture, turgor. No open lesions present. Verruciform lesion appears healed.     Splinter removed from the left 2nd plantar IPJ.     Musculoskeletal/Orthopaedic:   Structurally within normal limits.  +5/5 muscle strength Dorsiflexion, Plantarflexion, Inversion, Eversion bilaterally.  ROM of the 1st MTP joint is full bilaterally.   ROM of the MTJ/STJ is full without pain or crepitus bilaterally.   Ankle joint ROM is decreased bilaterally.    Encounter Medications and Orders:  No orders of the defined types were placed in this encounter.      Imaging:  None today.       Assessment:    ICD-10-CM    1.  Plantar wart of right foot  B07.0       2. Splinter in skin  T14.8XXA  Plan:   A comprehensive history and physical examination were preformed. The patient was educated on clinical and radiographic findings, diagnosis and treatment plans. Patient state that she understands all that has been explained and all questions were answered to her apparent satisfaction.     Informed the patient that she no longer needs to apply Aldara to the wart lesion due to it being healed.     Advised the patient to watch for additional lesions that may appear.     Discussed applying Aquaphor cream or Vaseline for dry skin.     Instructed patient to contact our office if any foot problems develop before next visit.    During office visit today left foot was also examined, left plantar 2nd MPJ there is a splinter noted which was superficial,  Splinter removed from the left 2nd plantar IPJ.     Follow up as needed.     Injection(s)/Procedure(s):  None today.      Follow-up:  Return if symptoms worsen or fail to improve.    I am scribing for, and in the presence of, Dr. Bartholome Bill for services provided on 10/26/2021.  Grace Isaac, SCRIBE   Grace Isaac, [place]  10/26/2021, 16:11      I personally performed the services described in this documentation, as scribed  in my presence, and it is both accurate  and complete.    Bartholome Bill, DPM  Bartholome Bill, DPM  10/26/2021, 23:00

## 2021-11-05 ENCOUNTER — Other Ambulatory Visit (INDEPENDENT_AMBULATORY_CARE_PROVIDER_SITE_OTHER): Payer: Self-pay | Admitting: Family Medicine

## 2021-11-05 DIAGNOSIS — R7309 Other abnormal glucose: Secondary | ICD-10-CM

## 2021-11-05 DIAGNOSIS — E119 Type 2 diabetes mellitus without complications: Secondary | ICD-10-CM

## 2021-11-06 MED ORDER — TRULICITY 3 MG/0.5 ML SUBCUTANEOUS PEN INJECTOR
PEN_INJECTOR | SUBCUTANEOUS | 2 refills | Status: DC
Start: 2021-11-06 — End: 2021-12-17

## 2021-11-06 NOTE — Telephone Encounter (Signed)
Last scheduled appointment with you was 07/15/2021.  Currently scheduled future appointment is Visit date not found.      Rochele Pages, LPN  0/08/7942, 46:19

## 2021-11-08 IMAGING — DX DG CHEST 2V
2 series · 2 of 2 positions shown · non-contrast
Comparison: None.

CLINICAL DATA: Persistent cough for 2 weeks

EXAM:
CHEST - 2 VIEW

[chest pa]
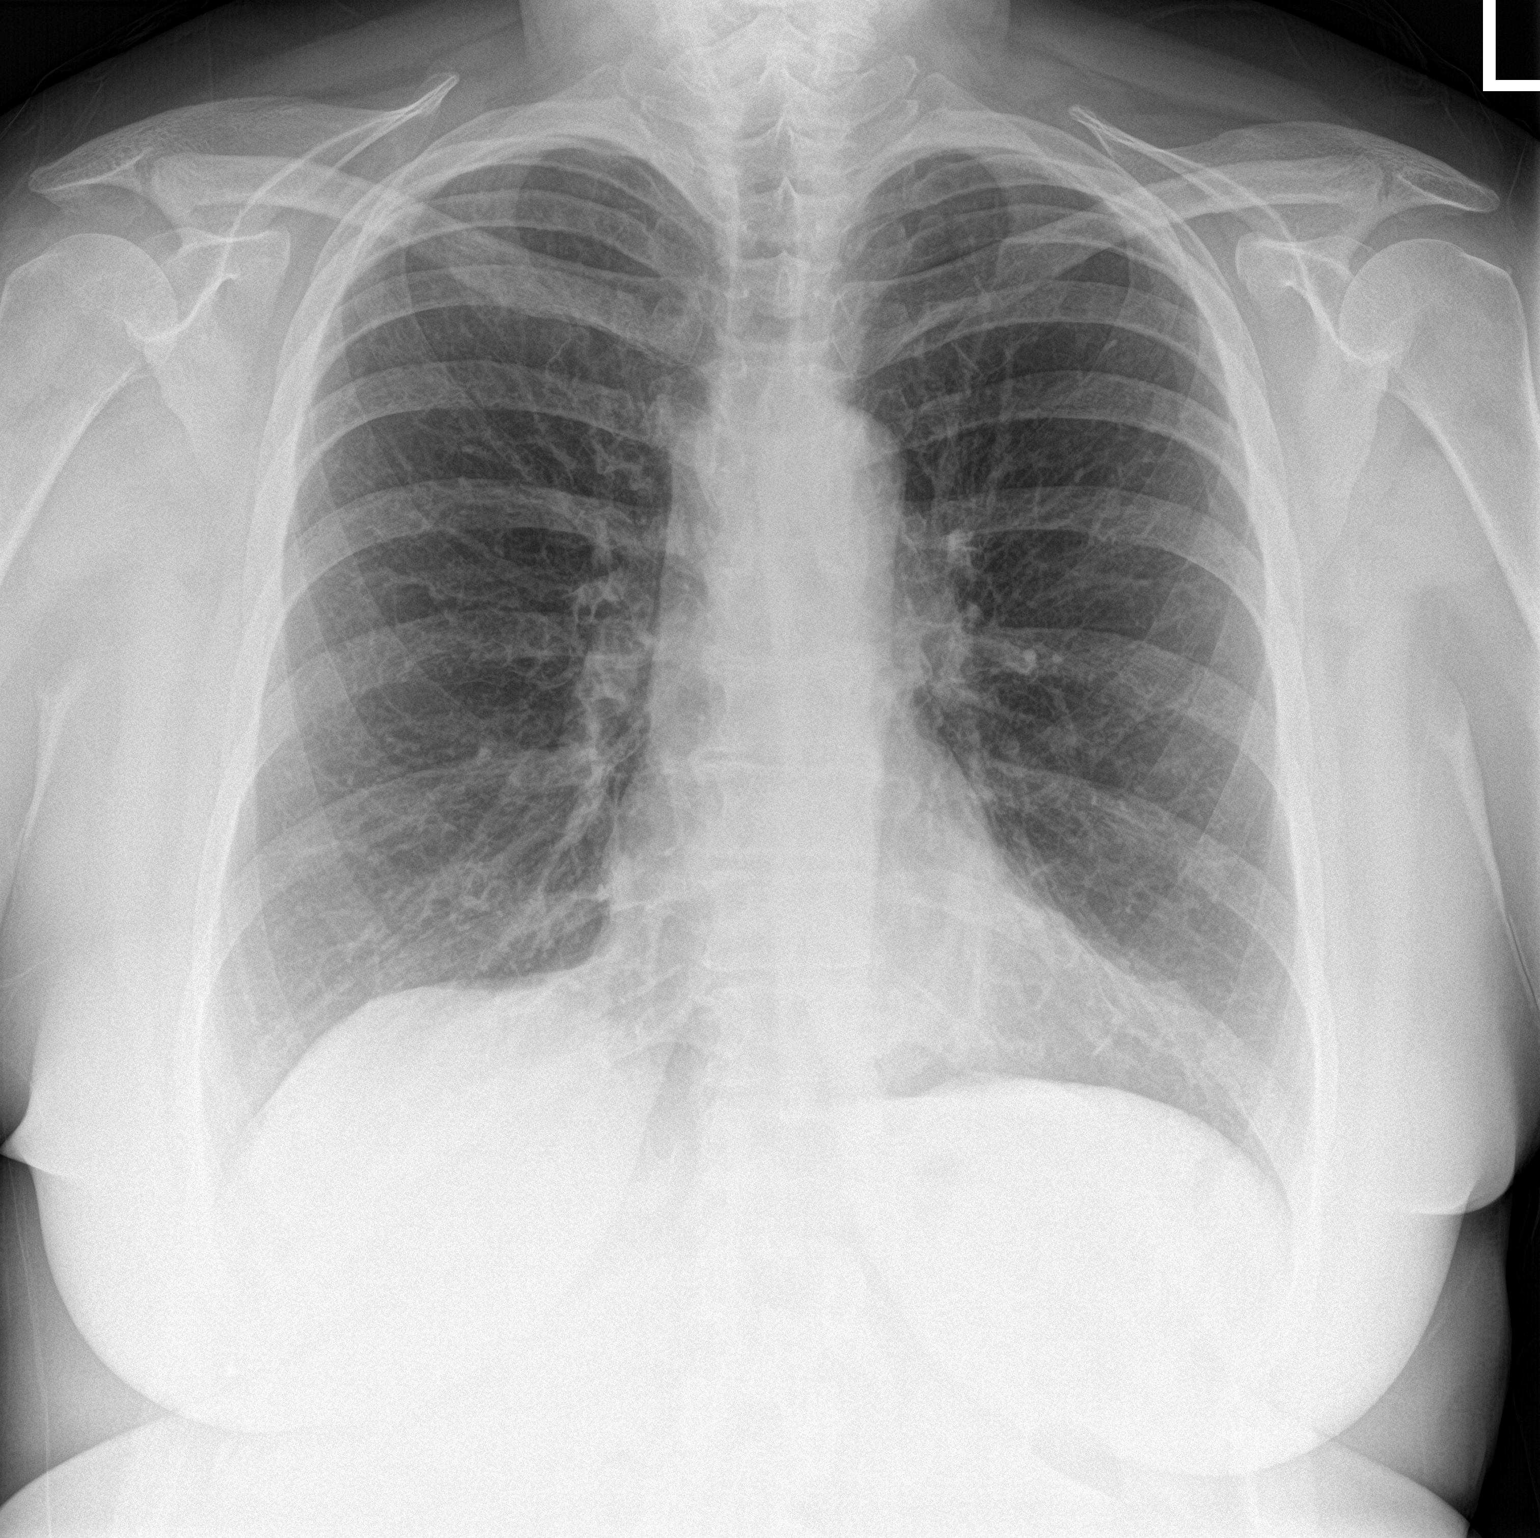

[chest lat]
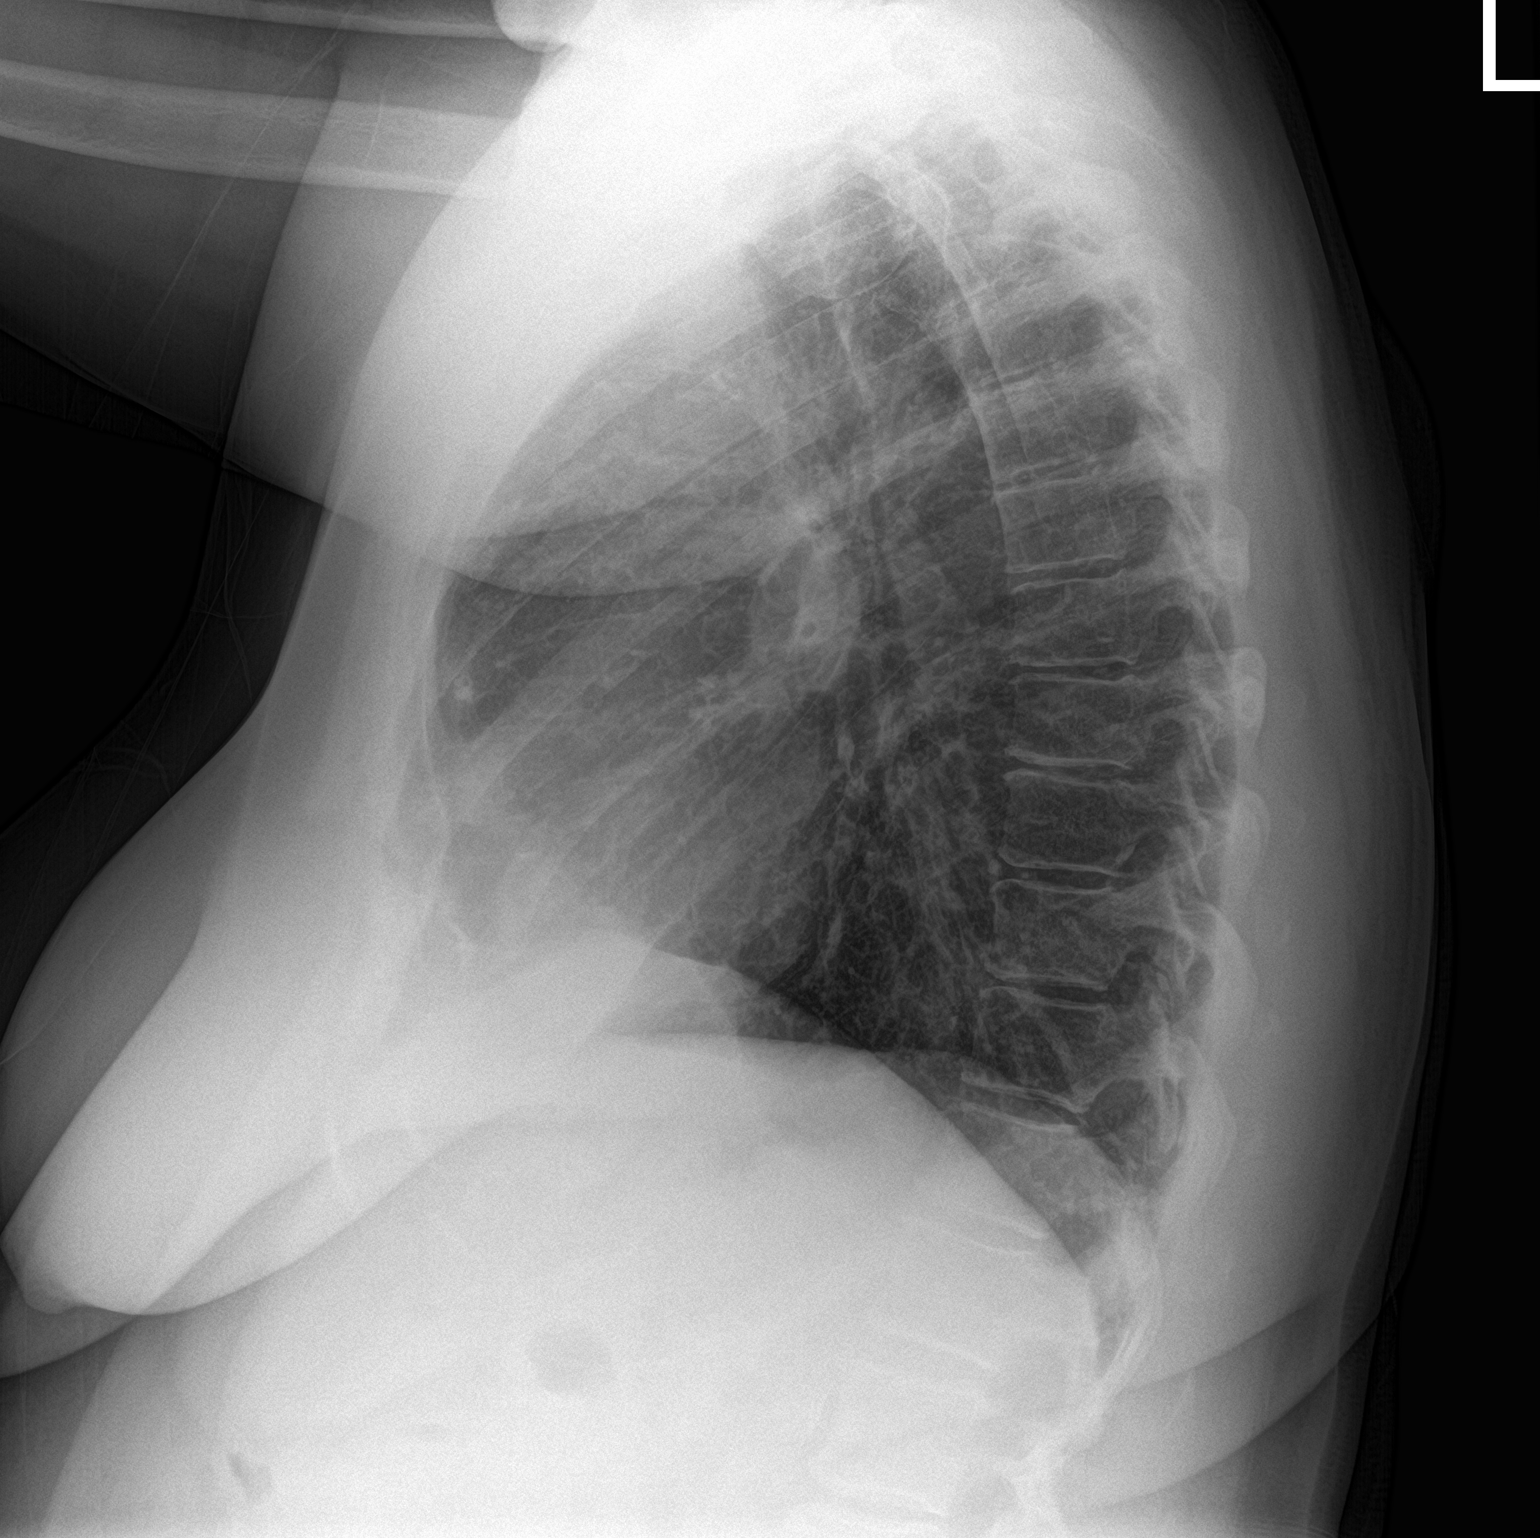

[2 of 2 positions shown; findings below may reference images not displayed]

FINDINGS: Mild bronchitic changes. No focal opacity or pleural effusion.
Normal cardiomediastinal silhouette. No pneumothorax.
IMPRESSION: No active cardiopulmonary disease.

## 2021-11-26 ENCOUNTER — Encounter (HOSPITAL_COMMUNITY): Payer: Self-pay

## 2021-12-13 ENCOUNTER — Other Ambulatory Visit (HOSPITAL_BASED_OUTPATIENT_CLINIC_OR_DEPARTMENT_OTHER): Payer: Self-pay | Admitting: Family Medicine

## 2021-12-13 DIAGNOSIS — I10 Essential (primary) hypertension: Secondary | ICD-10-CM

## 2021-12-16 ENCOUNTER — Other Ambulatory Visit (HOSPITAL_BASED_OUTPATIENT_CLINIC_OR_DEPARTMENT_OTHER): Payer: Self-pay | Admitting: Family Medicine

## 2021-12-16 DIAGNOSIS — I10 Essential (primary) hypertension: Secondary | ICD-10-CM

## 2021-12-17 ENCOUNTER — Other Ambulatory Visit: Payer: Self-pay

## 2021-12-17 ENCOUNTER — Ambulatory Visit (INDEPENDENT_AMBULATORY_CARE_PROVIDER_SITE_OTHER): Payer: 59 | Admitting: Family Medicine

## 2021-12-17 ENCOUNTER — Encounter (INDEPENDENT_AMBULATORY_CARE_PROVIDER_SITE_OTHER): Payer: Self-pay | Admitting: Family Medicine

## 2021-12-17 ENCOUNTER — Other Ambulatory Visit: Payer: 59 | Attending: Family Medicine

## 2021-12-17 VITALS — BP 120/84 | HR 87 | Resp 18 | Ht 62.0 in | Wt 208.0 lb

## 2021-12-17 DIAGNOSIS — Z23 Encounter for immunization: Secondary | ICD-10-CM

## 2021-12-17 DIAGNOSIS — E119 Type 2 diabetes mellitus without complications: Secondary | ICD-10-CM | POA: Insufficient documentation

## 2021-12-17 DIAGNOSIS — R7309 Other abnormal glucose: Secondary | ICD-10-CM

## 2021-12-17 DIAGNOSIS — Z1239 Encounter for other screening for malignant neoplasm of breast: Secondary | ICD-10-CM

## 2021-12-17 LAB — CBC WITH DIFF
BASOPHIL #: <0.1 10*3/uL (ref <=0.20)
BASOPHIL %: 1 %
EOSINOPHIL #: 0.1 10*3/uL (ref <=0.50)
EOSINOPHIL %: 3 %
HCT: 39.8 % (ref 34.8–46.0)
HGB: 13.1 g/dL (ref 11.5–16.0)
IMMATURE GRANULOCYTE #: <0.1 10*3/uL (ref <0.10)
IMMATURE GRANULOCYTE %: 0 % (ref 0–1)
LYMPHOCYTE #: 1.31 10*3/uL (ref 1.00–4.80)
LYMPHOCYTE %: 35 %
MCH: 30.1 pg (ref 26.0–32.0)
MCHC: 32.9 g/dL (ref 31.0–35.5)
MCV: 91.5 fL (ref 78.0–100.0)
MONOCYTE #: 0.33 10*3/uL (ref 0.20–1.10)
MONOCYTE %: 9 %
MPV: 9.4 fL (ref 8.7–12.5)
NEUTROPHIL #: 2.02 10*3/uL (ref 1.50–7.70)
NEUTROPHIL %: 52 %
PLATELETS: 208 10*3/uL (ref 150–400)
RBC: 4.35 10*6/uL (ref 3.85–5.22)
RDW-CV: 12.4 % (ref 11.5–15.5)
WBC: 3.8 10*3/uL (ref 3.7–11.0)

## 2021-12-17 LAB — COMPREHENSIVE METABOLIC PNL, FASTING
ALBUMIN: 3.8 g/dL (ref 3.5–5.0)
ALKALINE PHOSPHATASE: 78 U/L (ref 50–130)
ALT (SGPT): 21 U/L (ref 8–22)
ANION GAP: 8 mmol/L (ref 4–13)
AST (SGOT): 16 U/L (ref 8–45)
BILIRUBIN TOTAL: 0.4 mg/dL (ref 0.3–1.3)
BUN/CREA RATIO: 21 (ref 6–22)
BUN: 14 mg/dL (ref 8–25)
CALCIUM: 9.5 mg/dL (ref 8.5–10.0)
CHLORIDE: 105 mmol/L (ref 96–111)
CO2 TOTAL: 27 mmol/L (ref 22–30)
CREATININE: 0.67 mg/dL (ref 0.60–1.05)
ESTIMATED GFR: >90 mL/min/BSA (ref >=60)
GLUCOSE: 123 mg/dL — ABNORMAL HIGH (ref 70–99)
POTASSIUM: 4 mmol/L (ref 3.5–5.1)
PROTEIN TOTAL: 6.5 g/dL (ref 6.4–8.3)
SODIUM: 140 mmol/L (ref 136–145)

## 2021-12-17 LAB — LIPID PANEL
CHOL/HDL RATIO: 3.4
CHOLESTEROL: 165 mg/dL (ref 100–200)
HDL CHOL: 48 mg/dL — ABNORMAL LOW (ref >=50)
LDL CALC: 95 mg/dL (ref <100)
NON-HDL: 117 mg/dL (ref <=190)
TRIGLYCERIDES: 125 mg/dL (ref <150)
VLDL CALC: 20 mg/dL (ref <30)

## 2021-12-17 LAB — MICROALBUMIN/CREATININE RATIO, URINE, RANDOM
CREATININE RANDOM URINE: 126 mg/dL — ABNORMAL HIGH (ref 50–100)
MICROALBUMIN RANDOM URINE: 1.2 mg/dL
MICROALBUMIN/CREATININE RATIO RANDOM URINE: 9.5 mg/g (ref <30.0)

## 2021-12-17 LAB — HGA1C (HEMOGLOBIN A1C WITH EST AVG GLUCOSE): HEMOGLOBIN A1C: 6.7 % — ABNORMAL HIGH (ref <5.7)

## 2021-12-17 MED ORDER — DULAGLUTIDE 4.5 MG/0.5 ML SUBCUTANEOUS PEN INJECTOR
4.5000 mg | PEN_INJECTOR | SUBCUTANEOUS | 5 refills | Status: DC
Start: 2021-12-17 — End: 2022-01-12

## 2021-12-17 NOTE — Progress Notes (Signed)
[facility], [facility]  [place] [address]       Name: Andrea Huerta MRN:  [id number]   Date: 12/17/2021 Age: 61 y.o.     Chief complaint: The patient is a 61 y.o. old female who came in today for diabetes mellitus management    HPI:     Diabetes: Last HbA1C was 6.8%.  Five months ago.  Patient is willing to have repeat done today.  Patient denies symptoms of polydipsia, polyuria, hypoglycemic unawareness, chest pain, dyspnea on exertion, diarrhea, foot ulcerations, paresthesias. Symptoms are not changed. Patient's home blood sugars are controlled. Patient is compliant with medications.  She is currently prescribed Trulicity, and metformin.  She is had no recent side effects of Trulicity.    GERD: Patient reports symptoms of acid reflux, and denies symptoms of abdominal pain, nausea/vomiting, diarrhea, dysphagia, odynophagia, melena, hematochezia. Symptoms have been present for several years and are described as controlled. Medications and dietary changes alleviate symptoms. Symptoms are not exacerbated by caffeine, tobacco use, alcohol use, NSAID medications, aspirin, or corticosteroid use.     Anxiety: Patient complains of fewer symptoms of racing thoughts, feelings of losing control. She states that symptoms have been present for the past several years. Course to date has been well controlled. She denies current suicidal and homicidal ideation. No major difficulty in behaviors is reported. GAD-7 reviewed with patient in room. Patient is not currently involved with psychiatry or counseling.  She is currently prescribed an SSRI with good results.      ROS:  Review of systems completed with all positives and pertinent negatives as per HPI.  Otherwise, negative.    Past medical history:  Past Medical History:   Diagnosis Date   . Diabetes mellitus, type 2 (CMS HCC)    . H/O complete eye exam 01/2016   . H/O mammogram 2017   . High cholesterol    . History of  dental examination 04/2016   . Hypertension    . Sleep apnea    . Vaginal Pap smear 10/2014     Past surgical history:  Past Surgical History:   Procedure Laterality Date   . HX CESAREAN SECTION     . HX TONSILLECTOMY     . HX WISDOM TEETH EXTRACTION     . MOLE REMOVAL      several all normal     Medications:  Current Outpatient Medications   Medication Sig   . ASPIRIN (ASPIR-81 ORAL) Once a day   . azelastine (ASTELIN) 137 mcg (0.1 %) Nasal Aerosol, Spray Administer 1 Spray into each nostril Twice daily Use in each nostril as directed   . Blood Sugar Diagnostic (FREESTYLE LITE STRIPS) Strip 1 Strip Twice daily   . busPIRone (BUSPAR) 5 mg Oral Tablet Take 1 Tab (5 mg total) by mouth Three times a day as needed (anxiety)   . dulaglutide 4.5 mg/0.5 mL Subcutaneous Pen Injector Inject 0.5 mL (4.5 mg total) under the skin Every 7 days   . escitalopram oxalate (LEXAPRO) 20 mg Oral Tablet TAKE 1 TABLET BY MOUTH EVERY DAY   . flash glucose scanning reader (FREESTYLE LIBRE 14 DAY READER) Does not apply Misc For checking blood sugars 4-5 times daily.   . flash glucose sensor (FREESTYLE LIBRE 14 DAY SENSOR) Does not apply Kit For checking blood sugars 4-5 times daily. On 4 insulin injections daily.   Marland Kitchen GLUCOSAM/GLUC SU/AC-ALP-D-GLUC (GLUCOSAMINE COMPLEX ORAL) Once a day   .  lisinopriL (PRINIVIL) 20 mg Oral Tablet TAKE 1 TABLET BY MOUTH EVERY DAY   . loratadine (CLARITIN) 10 mg Oral Tablet TAKE 1 TABLET BY MOUTH ONCE A DAY   . metFORMIN (GLUCOPHAGE) 500 mg Oral Tablet TAKE 1 TABLET (500 MG TOTAL) BY MOUTH THREE TIMES DAILY WITH MEALS   . MULTIVITAMIN (MULTIPLE VITAMINS ORAL) Once a day   . pantoprazole (PROTONIX) 40 mg Oral Tablet, Delayed Release (E.C.) TAKE 1 TABLET BY MOUTH ONCE A DAY   . prenatal vitamin-iron-folate Tablet Take 1 Tablet by mouth Once a day   . simvastatin (ZOCOR) 40 mg Oral Tablet TAKE 1 TABLET BY MOUTH EVERY DAY   . vitamin E 100 unit Oral Capsule Take 1 Capsule (100 Units total) by mouth Once a day      Allergies:  No Known Allergies     Family history:  Family Medical History:     Problem Relation (Age of Onset)    Breast Cancer Mother    Diabetes Mother, Maternal Grandfather    High Cholesterol Mother, Brother    Hypertension (High Blood Pressure) Mother, Father, Brother    Liver Cancer Father    Lymphoma Maternal Aunt    Melanoma Maternal Aunt    No Known Problems Sister, Maternal Grandmother, Paternal Grandmother, Paternal 75, Daughter, Son, Maternal Uncle, Paternal 10, Paternal Uncle, Other        Social history:  Social History     Socioeconomic History   . Marital status: Married   Occupational History   . Occupation: math Buyer, retail   Tobacco Use   . Smoking status: Never   . Smokeless tobacco: Never   Substance and Sexual Activity   . Alcohol use: Yes     Comment: 1 or 2 drinks per week       Vitals:    12/17/21 0924   BP: 120/84   Pulse: 87   Resp: 18   SpO2: 95%   Weight: 94.3 kg (208 lb)   Height: 1.575 m ('5\' 2"' )   BMI: 38.12           Three  year weight curve.      Physical Examination:    GENERAL: Pt is a pleasant, well-nourished, well-developed 61 y.o. female who is in NAD. Appears stated age  40:  head normocephalic, symmetrical facies. EOM intact b/l. PERRLA. Sclera non-icteric, non-injected. No rhinorrhea. Oropharyngeal mucous membranes are pink and moist.    NECK: supple. No masses, lymphadenopathy, JVD on exam. Trachea midline, no thyromegaly   CV: S1, S2. No murmurs, rubs, gallops.     LUNGS: CTAB, No rhonchi, rales, wheezes.   GI: (+) BS in all 4 quadrants. soft, NT/ND. No rigidity/ guarding/ rebound. No organomegaly/masses, or abdominal bruits  MSK: Spontaneous normal AROM of major joints as observed during exam. No joint effusions/ swelling/ deformities.   No BLE edema, clubbing, or cyanosis.   2+ radial pulse present b/l.   + 2 reflexes Brachioradialis and patellar bilaterally.   Gait normal.  SKIN: No significant lesions, rashes, ecchymoses noted.  Multiple skin  tags appreciated about the neckline bilaterally.    NEURO: AAOx4, CN grossly intact. Sensation intact b/l. No focal deficits.  PSYCH: Mood, behavior and affect normal w/ Intact judgement & insight        12/17/21 0926   PHQ 9 (follow up)   Little interest or pleasure in doing things. 1   Feeling down, depressed, or hopeless 1   Trouble falling or staying asleep, or  sleeping too much. 0   Feeling tired or having little energy 1   Poor appetite or overeating 0   Feeling bad about yourself/ that you are a failure in the past 2 weeks? 0   Trouble concentrating on things in the past 2 weeks? 0   Moving/Speaking slowly or being fidgety or restless  in the past 2 weeks? 0   Thoughts that you would be better off DEAD, or of hurting yourself in some way. 0   If you checked off any problems, how difficult have these problems made it for you to do your work, take care of things at home, or get along with other people? Not difficult at all   PHQ 9 Total 3   Interpretation of Total Score No depression         Diabetes Monitors  A1C: 6.8  A1C Date: 08/17/2021           Nephropathy Screening: On ACEI or ARB  Lab Results   Component Value Date    CHOLESTEROL 168 12/31/2020    HDLCHOL 40 (L) 12/31/2020    LDLCHOL 97 12/31/2020    TRIG 156 (H) 12/31/2020     Retinal Exam Date: 06/27/2020  Last diabetic foot exam: Not Found       Visit Diagnosis    Encounter Diagnoses   Name Primary?   . Type 2 diabetes mellitus without complication, without long-term current use of insulin (CMS HCC) Yes   . Need for vaccination    . Hemoglobin A1C greater than 9%, indicating poor diabetic control    . Screening breast examination      Orders Placed This Encounter   . MAMMO BILATERAL SCREENING-ADDL VIEWS/BREAST US AS REQ BY RAD   . Vaxneuvance (PCV 15) (Admin)   . HGA1C (HEMOGLOBIN A1C WITH EST AVG GLUCOSE)   . MICROALBUMIN/CREATININE RATIO, URINE, RANDOM   . CBC/DIFF   . COMPREHENSIVE METABOLIC PNL, FASTING   . LIPID PANEL   . dulaglutide 4.5 mg/0.5  mL Subcutaneous Pen Injector       Type 2 diabetes mellitus without complication, without long-term current use of insulin (CMS HCC)  Chronic, improved control  Continues to trend down from 9.7 to less than 7 at last encounter.    She will be due for a new A1c.  Continue improved compliance with diabetic diet.  Continue decrease carbohydrate and sugar intake.   Continue structured exercise/ activity for weight loss.   Check blood sugars only as needed and for reference for her dietary changes.   Continue metformin 500 mg to take 3 times a day.    Will increase Trulicity.  To 4.5 mg q.week.  Continue lisinopril 20 mg daily.   Continue simvastatin 40 mg daily.   Will need foot exam. -she is being referred to Podiatry.  (patient has flat warts or early corns developing as well)  Patient follows with ophthalmologist annually.     Essential hypertension  Chronic, now controlled.  Encouraged lifestyle modifications.   Continue behavioral efforts, esp  Cardiac friendly diet,   < 2 gm salt q day.   Exercise 20 min q day most days of the week.  Continue ACEI - lisinopril 20 mg daily.   Labs ordered.     Anxiety and depression  Chronic, controlled with medication.   No SI/HI thoughts voiced at this time.   Continue Lexapro 20 mg.  Continue BuSpar 5 mg TID PRN for breakthrough anxiety.     Vaginal itching-improved.  Consider atrophic vaginitis   Consider Estrace cream.  Her last Pap was 03/31/2020.  She has not had a hysterectomy.    Otitis media right side:  Resolved.    Continues to have cerumen with debris.    Recommend sweet oil.    Will monitor.      Screening for HIV (human immunodeficiency virus)  Encounter for hepatitis C virus screening test for high risk patient  Labs previously ordered.  Discussed HIV and HCV screening with patient in office.   Ordered with labs.      Screening for breast cancer  Mammogram ordered.  Due in July 2023.    Return in about 6 months (around 06/18/2022), or if symptoms worsen or fail to  improve, for In Person Visit, diabetes, hypertension.    Cherly Beach, MD  07/15/2021, 16:16  Electronically signed by Cherly Beach, MD    This note was partially generated using MModal Fluency Direct system, and there may be some incorrect words, spellings, and punctuation that were not noted in checking the note before saving.

## 2021-12-17 NOTE — Nursing Note (Signed)
12/17/21 0926   PHQ 9 (follow up)   Little interest or pleasure in doing things. 1   Feeling down, depressed, or hopeless 1   Trouble falling or staying asleep, or sleeping too much. 0   Feeling tired or having little energy 1   Poor appetite or overeating 0   Feeling bad about yourself/ that you are a failure in the past 2 weeks? 0   Trouble concentrating on things in the past 2 weeks? 0   Moving/Speaking slowly or being fidgety or restless  in the past 2 weeks? 0   Thoughts that you would be better off DEAD, or of hurting yourself in some way. 0   If you checked off any problems, how difficult have these problems made it for you to do your work, take care of things at home, or get along with other people? Not difficult at all   PHQ 9 Total 3   Interpretation of Total Score No depression

## 2021-12-18 NOTE — Result Encounter Note (Signed)
Please inform the patient that her labs are back.      Her urine test showed she is not spilling significant amounts of protein.  This is very good news.      Her hemoglobin A1c was 6.7% that is down from 4 months ago.  Encouraged her to keep up the good work.      Her metabolic panel showed normal electrolytes, good kidney and good liver function.      Her lipid panel showed her total cholesterol and her bad cholesterol were near goal.    Her blood count showed no sign of infection, no sign of anemia,  her platelet count was normal.      Making no changes to her plan of care.      TY TH

## 2022-01-04 ENCOUNTER — Encounter (INDEPENDENT_AMBULATORY_CARE_PROVIDER_SITE_OTHER): Payer: Self-pay | Admitting: Family Medicine

## 2022-01-04 DIAGNOSIS — E119 Type 2 diabetes mellitus without complications: Secondary | ICD-10-CM

## 2022-01-12 NOTE — Telephone Encounter (Signed)
-----   Message from [name] sent at 01/11/2022  6:57 PM EDT -----  Regarding: Trulicty 4.5  Contact: [phone]  Yes, new script because Olivia Mackie I creased my dosage to 4.5 maybe

## 2022-01-12 NOTE — Telephone Encounter (Signed)
Last scheduled appointment with you was 12/17/2021.  Currently scheduled future appointment is 06/17/2022.      Rochele Pages, LPN  4/58/0998, 33:82

## 2022-01-13 MED ORDER — DULAGLUTIDE 4.5 MG/0.5 ML SUBCUTANEOUS PEN INJECTOR
4.5000 mg | PEN_INJECTOR | SUBCUTANEOUS | 5 refills | Status: DC
Start: 2022-01-13 — End: 2022-11-19

## 2022-01-14 ENCOUNTER — Other Ambulatory Visit (HOSPITAL_BASED_OUTPATIENT_CLINIC_OR_DEPARTMENT_OTHER): Payer: Self-pay | Admitting: Family Medicine

## 2022-01-14 DIAGNOSIS — I10 Essential (primary) hypertension: Secondary | ICD-10-CM

## 2022-01-14 NOTE — Telephone Encounter (Signed)
Last scheduled appointment with you was 12/17/2021.  Currently scheduled future appointment is 06/17/22      Rochele Pages, LPN  2/95/1884, 16:60

## 2022-03-08 ENCOUNTER — Encounter (INDEPENDENT_AMBULATORY_CARE_PROVIDER_SITE_OTHER): Payer: Self-pay

## 2022-03-08 ENCOUNTER — Ambulatory Visit (INDEPENDENT_AMBULATORY_CARE_PROVIDER_SITE_OTHER): Payer: 59 | Admitting: NURSE PRACTITIONER

## 2022-03-08 ENCOUNTER — Other Ambulatory Visit: Payer: Self-pay

## 2022-03-08 VITALS — BP 128/86 | HR 78 | Temp 98.9°F | Resp 18 | Ht 62.0 in | Wt 211.0 lb

## 2022-03-08 DIAGNOSIS — R051 Acute cough: Secondary | ICD-10-CM

## 2022-03-08 DIAGNOSIS — H66003 Acute suppurative otitis media without spontaneous rupture of ear drum, bilateral: Secondary | ICD-10-CM

## 2022-03-08 DIAGNOSIS — J029 Acute pharyngitis, unspecified: Secondary | ICD-10-CM

## 2022-03-08 MED ORDER — BENZONATATE 100 MG CAPSULE
100.0000 mg | ORAL_CAPSULE | Freq: Three times a day (TID) | ORAL | 0 refills | Status: AC | PRN
Start: 2022-03-08 — End: 2022-03-15

## 2022-03-08 MED ORDER — AMOXICILLIN 500 MG CAPSULE
500.0000 mg | ORAL_CAPSULE | Freq: Three times a day (TID) | ORAL | 0 refills | Status: AC
Start: 2022-03-08 — End: 2022-03-15

## 2022-03-08 NOTE — Patient Instructions (Signed)
Follow up instructions:  Monitor for fever  Avoid respiratory irritants/allergens when possible.  Notify clinic if symptoms not improving next 3 days  Practice infection control measures to prevent spread of infection.   Assure adequate rest.

## 2022-03-08 NOTE — Progress Notes (Signed)
[facility]    Progress Note    Name: Andrea Huerta MRN:  [id number]   Date: 03/08/2022 Age: 61 y.o.         Reason for Visit: Cough (Onset Friday), Sore Throat, Sinus Drainage, and Ear Fullness    Nursing Notes:  There are no exam notes on file for this visit.    History of Present Illness  Andrea Huerta is a 61 y.o. female who is being seen today for above symptoms which began 4 days ago and have worsened in severity.     Past Medical History:   Diagnosis Date    Diabetes mellitus, type 2 (CMS [place])     H/O complete eye exam 01/2016    H/O mammogram 2017    High cholesterol     History of dental examination 04/2016    Hypertension     Sleep apnea     Vaginal Pap smear 10/2014         Past Surgical History:   Procedure Laterality Date    HX CESAREAN SECTION      HX TONSILLECTOMY      HX WISDOM TEETH EXTRACTION      MOLE REMOVAL      several all normal         Current Outpatient Medications   Medication Sig    amoxicillin (AMOXIL) 500 mg Oral Capsule Take 1 Capsule (500 mg total) by mouth Three times a day for 7 days    ASPIRIN (ASPIR-81 ORAL) Once a day    azelastine (ASTELIN) 137 mcg (0.1 %) Nasal Aerosol, Spray Administer 1 Spray into each nostril Twice daily Use in each nostril as directed    benzonatate (TESSALON) 100 mg Oral Capsule Take 1 Capsule (100 mg total) by mouth Three times a day as needed for Cough for up to 7 days    Blood Sugar Diagnostic (FREESTYLE LITE STRIPS) Strip 1 Strip Twice daily    busPIRone (BUSPAR) 5 mg Oral Tablet Take 1 Tab (5 mg total) by mouth Three times a day as needed (anxiety)    dulaglutide 4.5 mg/0.5 mL Subcutaneous Pen Injector Inject 0.5 mL (4.5 mg total) under the skin Every 7 days    escitalopram oxalate (LEXAPRO) 20 mg Oral Tablet TAKE 1 TABLET BY MOUTH EVERY DAY    flash glucose scanning reader (FREESTYLE LIBRE 14 DAY READER) Does not apply Misc For checking blood sugars 4-5 times daily.    flash glucose sensor (FREESTYLE  LIBRE 14 DAY SENSOR) Does not apply Kit For checking blood sugars 4-5 times daily. On 4 insulin injections daily.    GLUCOSAM/GLUC SU/AC-ALP-D-GLUC (GLUCOSAMINE COMPLEX ORAL) Once a day    lisinopriL (PRINIVIL) 20 mg Oral Tablet TAKE 1 TABLET BY MOUTH EVERY DAY    loratadine (CLARITIN) 10 mg Oral Tablet TAKE 1 TABLET BY MOUTH ONCE A DAY    metFORMIN (GLUCOPHAGE) 500 mg Oral Tablet TAKE 1 TABLET (500 MG TOTAL) BY MOUTH THREE TIMES DAILY WITH MEALS    MULTIVITAMIN (MULTIPLE VITAMINS ORAL) Once a day (Patient not taking: Reported on 03/08/2022)    pantoprazole (PROTONIX) 40 mg Oral Tablet, Delayed Release (E.C.) TAKE 1 TABLET BY MOUTH ONCE A DAY    prenatal vitamin-iron-folate Tablet Take 1 Tablet by mouth Once a day    simvastatin (ZOCOR) 40 mg Oral Tablet TAKE 1 TABLET BY MOUTH EVERY DAY    vitamin E 100 unit Oral Capsule Take 1 Capsule (100 Units  total) by mouth Once a day     No Known Allergies  Family Medical History:       Problem Relation (Age of Onset)    Breast Cancer Mother    Diabetes Mother, Maternal Grandfather    High Cholesterol Mother, Brother    Hypertension (High Blood Pressure) Mother, Father, Brother    Liver Cancer Father    Lymphoma Maternal Aunt    Melanoma Maternal Aunt    No Known Problems Sister, Maternal Grandmother, Paternal Grandmother, Paternal 68, Daughter, Son, Maternal Uncle, Paternal 26, Paternal Uncle, Other            Social History     Tobacco Use    Smoking status: Never    Smokeless tobacco: Never   Substance Use Topics    Alcohol use: Yes     Comment: 1 or 2 drinks per week    Drug use: Never       Review of Systems  Review of Systems   Constitutional:  Negative for fever.   HENT:  Positive for congestion, ear pain and sore throat.    Respiratory:  Positive for cough and sputum production.    Cardiovascular:  Negative for chest pain.     Physical Exam:  BP 128/86   Pulse 78   Temp 37.2 C (98.9 F)   Resp 18   Ht 1.575 m ('5\' 2"' )   Wt 95.7 kg (211 lb)   LMP  (LMP  Unknown)   SpO2 97%   BMI 38.59 kg/m       Physical Exam  HENT:      Right Ear: Tympanic membrane is erythematous and bulging.      Left Ear: Tympanic membrane is erythematous and bulging.      Nose: Congestion present.      Mouth/Throat:      Pharynx: Posterior oropharyngeal erythema present.   Cardiovascular:      Rate and Rhythm: Regular rhythm.   Pulmonary:      Effort: Pulmonary effort is normal.      Breath sounds: Normal breath sounds.   Skin:     General: Skin is warm and dry.   Neurological:      Mental Status: She is alert.       Assessment:    Non-recurrent acute suppurative otitis media of both ears without spontaneous rupture of tympanic membranes    Acute pharyngitis    Acute cough       Plan:  Patient Instructions   Follow up instructions:  Monitor for fever  Avoid respiratory irritants/allergens when possible.  Notify clinic if symptoms not improving next 3 days  Practice infection control measures to prevent spread of infection.   Assure adequate rest.      Orders Placed This Encounter    benzonatate (TESSALON) 100 mg Oral Capsule    amoxicillin (AMOXIL) 500 mg Oral Capsule        MDM:      During the patient's visit at urgent care patient's chart was reviewed; HPI, ROS and physical exam were completed to assist in medical decision making.    Medications were reviewed and reconciled. Medication instructions and side effects were discussed.  Advised to seek immediate medical care with any new, worsening, or concerning symptoms.  Opportunity to ask questions was provided and all questions were answered.  Discussed diagnosis and management including indications for return, importance of close follow up and supportive care measures prior to patient discharge.  Thomasenia Bottoms, FNP-BC  03/08/2022, 12:12

## 2022-03-17 ENCOUNTER — Other Ambulatory Visit (INDEPENDENT_AMBULATORY_CARE_PROVIDER_SITE_OTHER): Payer: Self-pay | Admitting: Family Medicine

## 2022-03-17 ENCOUNTER — Other Ambulatory Visit (HOSPITAL_BASED_OUTPATIENT_CLINIC_OR_DEPARTMENT_OTHER): Payer: Self-pay | Admitting: Family Medicine

## 2022-03-17 DIAGNOSIS — J309 Allergic rhinitis, unspecified: Secondary | ICD-10-CM

## 2022-03-17 DIAGNOSIS — K219 Gastro-esophageal reflux disease without esophagitis: Secondary | ICD-10-CM

## 2022-03-17 NOTE — Telephone Encounter (Signed)
Last scheduled appointment with you was 12/17/2021.     Currently scheduled future appointment is 06/17/22.      Reymundo Poll, RN  03/17/2022, 15:42

## 2022-03-18 NOTE — Telephone Encounter (Signed)
Last scheduled appointment with you was 12/17/2021.      Currently scheduled future appointment is 06/17/22.       Reymundo Poll, RN  03/18/2022, 11:48

## 2022-03-30 ENCOUNTER — Encounter (INDEPENDENT_AMBULATORY_CARE_PROVIDER_SITE_OTHER): Payer: Self-pay | Admitting: Family Medicine

## 2022-04-12 ENCOUNTER — Other Ambulatory Visit (HOSPITAL_BASED_OUTPATIENT_CLINIC_OR_DEPARTMENT_OTHER): Payer: Self-pay | Admitting: Family Medicine

## 2022-04-12 DIAGNOSIS — F419 Anxiety disorder, unspecified: Secondary | ICD-10-CM

## 2022-04-12 DIAGNOSIS — E119 Type 2 diabetes mellitus without complications: Secondary | ICD-10-CM

## 2022-04-12 NOTE — Telephone Encounter (Signed)
Last scheduled appointment with you was 12/17/2021.  Currently scheduled future appointment is 06/17/22      Rochele Pages, LPN  02/54/2706, 23:76

## 2022-04-19 ENCOUNTER — Encounter (INDEPENDENT_AMBULATORY_CARE_PROVIDER_SITE_OTHER): Payer: Self-pay | Admitting: Family Medicine

## 2022-05-10 ENCOUNTER — Other Ambulatory Visit: Payer: Self-pay

## 2022-05-10 ENCOUNTER — Encounter (INDEPENDENT_AMBULATORY_CARE_PROVIDER_SITE_OTHER): Payer: Self-pay

## 2022-05-10 ENCOUNTER — Ambulatory Visit (INDEPENDENT_AMBULATORY_CARE_PROVIDER_SITE_OTHER): Payer: 59

## 2022-05-10 VITALS — BP 122/78 | HR 101 | Temp 98.8°F | Resp 16 | Ht 62.0 in | Wt 211.0 lb

## 2022-05-10 DIAGNOSIS — J329 Chronic sinusitis, unspecified: Secondary | ICD-10-CM

## 2022-05-10 MED ORDER — PROMETHAZINE-DM 6.25 MG-15 MG/5 ML ORAL SYRUP
5.0000 mL | ORAL_SOLUTION | Freq: Four times a day (QID) | ORAL | 1 refills | Status: DC | PRN
Start: 2022-05-10 — End: 2022-12-13

## 2022-05-10 MED ORDER — AMOXICILLIN 875 MG-POTASSIUM CLAVULANATE 125 MG TABLET
1.0000 | ORAL_TABLET | Freq: Two times a day (BID) | ORAL | 0 refills | Status: DC
Start: 2022-05-10 — End: 2022-05-25

## 2022-05-10 NOTE — Progress Notes (Signed)
URGENT CARE, [facility]  [place]  [place] [address]    History and Physical     Name: Andrea Huerta MRN:  [id number]   Date: 05/10/2022 Age: 61 y.o.            Subjective:     Chief Complaint:    Chief Complaint   Patient presents with    Sinus Problem    Cough    Generalized Body Aches    Sneezing       HPI:  Andrea Huerta is a 61 y.o. y/o female who presents today for the above stated complaints x3 days. Taking Mucinex and using Flonase without relief of symptoms. Took a home covid test and was negative. Denies any fevers, chills, or shortness of breath.    Past Medical History:   Diagnosis Date    Diabetes mellitus, type 2 (CMS [place])     H/O complete eye exam 01/2016    H/O mammogram 2017    High cholesterol     History of dental examination 04/2016    Hypertension     Sleep apnea     Vaginal Pap smear 10/2014     Past Surgical History:   Procedure Laterality Date    HX CESAREAN SECTION      HX TONSILLECTOMY      HX WISDOM TEETH EXTRACTION      MOLE REMOVAL      several all normal       Current Outpatient Medications   Medication Sig    amoxicillin-pot clavulanate (AUGMENTIN) 875-125 mg Oral Tablet Take 1 Tablet by mouth Twice daily for 7 days    ASPIRIN (ASPIR-81 ORAL) Once a day    azelastine (ASTELIN) 137 mcg (0.1 %) Nasal Aerosol, Spray Administer 1 Spray into each nostril Twice daily Use in each nostril as directed    Blood Sugar Diagnostic (FREESTYLE LITE STRIPS) Strip 1 Strip Twice daily    busPIRone (BUSPAR) 5 mg Oral Tablet Take 1 Tab (5 mg total) by mouth Three times a day as needed (anxiety)    dulaglutide 4.5 mg/0.5 mL Subcutaneous Pen Injector Inject 0.5 mL (4.5 mg total) under the skin Every 7 days    escitalopram oxalate (LEXAPRO) 20 mg Oral Tablet TAKE 1 TABLET BY MOUTH EVERY DAY    flash glucose scanning reader (FREESTYLE LIBRE 14 DAY READER) Does not apply Misc For checking blood sugars 4-5 times daily.    flash glucose sensor (FREESTYLE LIBRE 14 DAY SENSOR)  Does not apply Kit For checking blood sugars 4-5 times daily. On 4 insulin injections daily.    GLUCOSAM/GLUC SU/AC-ALP-D-GLUC (GLUCOSAMINE COMPLEX ORAL) Once a day    lisinopriL (PRINIVIL) 20 mg Oral Tablet TAKE 1 TABLET BY MOUTH EVERY DAY    loratadine (CLARITIN) 10 mg Oral Tablet TAKE 1 TABLET BY MOUTH EVERY DAY    metFORMIN (GLUCOPHAGE) 500 mg Oral Tablet TAKE 1 TABLET (500 MG TOTAL) BY MOUTH THREE TIMES DAILY WITH MEALS    MULTIVITAMIN (MULTIPLE VITAMINS ORAL) Once a day (Patient not taking: Reported on 03/08/2022)    pantoprazole (PROTONIX) 40 mg Oral Tablet, Delayed Release (E.C.) TAKE 1 TABLET BY MOUTH EVERY DAY    prenatal vitamin-iron-folate Tablet Take 1 Tablet by mouth Once a day    promethazine-dextromethorphan (PHENERGAN-DM) 6.25-15 mg/5 mL Oral Syrup Take 5 mL by mouth Four times a day as needed for Cough    simvastatin (ZOCOR) 40 mg Oral Tablet TAKE 1 TABLET BY MOUTH EVERY  DAY    vitamin E 100 unit Oral Capsule Take 1 Capsule (100 Units total) by mouth Once a day      No Known Allergies     ROS:  Review of Systems   HENT:  Positive for congestion.    Respiratory:  Positive for cough.    Musculoskeletal:  Positive for myalgias.   All other systems reviewed and are negative.       Objective:   Physical Exam  Vitals and nursing note reviewed.   Constitutional:       General: She is not in acute distress.     Appearance: Normal appearance. She is not ill-appearing.   HENT:      Head: Normocephalic.      Nose: Congestion present.   Cardiovascular:      Rate and Rhythm: Normal rate and regular rhythm.      Heart sounds: Normal heart sounds.   Pulmonary:      Effort: Pulmonary effort is normal.      Breath sounds: Normal breath sounds. No wheezing, rhonchi or rales.   Skin:     General: Skin is warm and dry.      Capillary Refill: Capillary refill takes less than 2 seconds.   Neurological:      General: No focal deficit present.      Mental Status: She is alert and oriented to person, place, and time.    Psychiatric:         Mood and Affect: Mood normal.         Behavior: Behavior normal.       Vitals:    05/10/22 1919   BP: 122/78   Pulse: (!) 101   Resp: 16   Temp: 37.1 C (98.8 F)   SpO2: 97%   Weight: 95.7 kg (211 lb)   Height: 1.575 m (_0 )   BMI: 38.67       Assessment & Plan:       ICD-10-CM    1. Sinusitis, unspecified chronicity, unspecified location  J32.9 promethazine-dextromethorphan (PHENERGAN-DM) 6.25-15 mg/5 mL Oral Syrup     amoxicillin-pot clavulanate (AUGMENTIN) 875-125 mg Oral Tablet           Orders Placed This Encounter    promethazine-dextromethorphan (PHENERGAN-DM) 6.25-15 mg/5 mL Oral Syrup    amoxicillin-pot clavulanate (AUGMENTIN) 875-125 mg Oral Tablet      Follow up with PCP as directed. For new or worsening symptoms, evaluation at your nearest Emergency Department may be needed.    Jasper Riling, APRN 05/10/2022 19:26     I saw this patient independently.   This note was partially generated using MModal Fluency Direct system, and there may be some incorrect words, spellings, and punctuation that were not noted in checking the note before saving.

## 2022-05-10 NOTE — Patient Instructions (Signed)
Increase fluid intake.  Start antibiotic.  Start analgesic/antipyretic for relief of sore throat and headache over the counter per package instructions:  -NSAID   -Acetaminophen  Start/continue intranasal steroid  - Flonase-2 sprays each nostrils once daily.  Start/continue oral antihistamine like Claritin or zyrtec.

## 2022-05-14 ENCOUNTER — Ambulatory Visit (INDEPENDENT_AMBULATORY_CARE_PROVIDER_SITE_OTHER): Payer: Self-pay

## 2022-05-14 NOTE — Telephone Encounter (Signed)
Pt notified of instructions

## 2022-05-14 NOTE — Telephone Encounter (Signed)
Pt seen on Monday for sinus infection Pt prescribed Augmentin and Prometh DM. Pt complains of thick drainage. Nasal drainage and congestion ,rt ear congested and muffled hearing. Pt states cough is better. Sneezing. Pt took a home COVID test yesterday morning and was negative. Pt thinking she may need something stronger than Amoxicillin.

## 2022-05-17 ENCOUNTER — Encounter (INDEPENDENT_AMBULATORY_CARE_PROVIDER_SITE_OTHER): Payer: Self-pay | Admitting: Family Medicine

## 2022-05-17 ENCOUNTER — Other Ambulatory Visit: Payer: Self-pay

## 2022-05-17 ENCOUNTER — Ambulatory Visit (INDEPENDENT_AMBULATORY_CARE_PROVIDER_SITE_OTHER): Payer: 59 | Admitting: NURSE PRACTITIONER

## 2022-05-17 ENCOUNTER — Encounter (INDEPENDENT_AMBULATORY_CARE_PROVIDER_SITE_OTHER): Payer: Self-pay

## 2022-05-17 VITALS — BP 120/78 | HR 105 | Temp 97.6°F | Resp 18 | Ht 62.0 in | Wt 208.1 lb

## 2022-05-17 DIAGNOSIS — H66019 Acute suppurative otitis media with spontaneous rupture of ear drum, unspecified ear: Secondary | ICD-10-CM

## 2022-05-17 DIAGNOSIS — H66001 Acute suppurative otitis media without spontaneous rupture of ear drum, right ear: Secondary | ICD-10-CM

## 2022-05-17 MED ORDER — CEFDINIR 300 MG CAPSULE
300.0000 mg | ORAL_CAPSULE | Freq: Two times a day (BID) | ORAL | 0 refills | Status: DC
Start: 2022-05-17 — End: 2022-05-25

## 2022-05-17 NOTE — Telephone Encounter (Signed)
Patient message below regarding ear infection.     Was prescribed Augmentin, finished this.   Then was seen [facility] for ear infection and given omnicef.     Patient is wanting to confirm that this is a better route of therapy than an ATB ear drop could give       Rochele Pages, LPN

## 2022-05-17 NOTE — Telephone Encounter (Signed)
-----   Message from [name] sent at 05/17/2022  9:30 AM EST -----  Regarding: ear infection  Contact: [phone]  I visited [facility] this morning.  Been sick for a week. I finished my antibiotic last night.  Now I have a bad inner ear infection.  The doctor at Urgent care told me there are not ear-drops for an ear infection.  I googled (dangerous thing to do) and found the following:    Ciprofloxacin and dexamethasone combination ear drops is used to treat ear infections, such as acute otitis externa and acute otitis media.      I was prescribed another oral antibiotic. Just want to be sure you think this is the best course of treatment.    Hopefully you can see the notes on my chart.    Thank you,  Andrea Huerta

## 2022-05-17 NOTE — Progress Notes (Signed)
[facility]    Progress Note    Name: Andrea Huerta MRN:  [id number]   Date: 05/17/2022 Age: 61 y.o.         Reason for Visit: Drainage Back Of Throat and Ear Fullness (Right ear feels "blocked off" and has "swwoshing", finished antibiotic yesterday )    Nursing Notes:  There are no exam notes on file for this visit.    History of Present Illness  Andrea Huerta is a 61 y.o. female who is being seen today for right ear discomfort, sinus congestion, and post nasal drainage. Patient had more intense cough last week but that has improved. She is mainly now having right ear symptoms.   Patient reports she took full course of Augmentin. She is also taking Mucinex.     Past Medical History:   Diagnosis Date    Diabetes mellitus, type 2 (CMS [place])     H/O complete eye exam 01/2016    H/O mammogram 2017    High cholesterol     History of dental examination 04/2016    Hypertension     Sleep apnea     Vaginal Pap smear 10/2014         Past Surgical History:   Procedure Laterality Date    HX CESAREAN SECTION      HX TONSILLECTOMY      HX WISDOM TEETH EXTRACTION      MOLE REMOVAL      several all normal         Current Outpatient Medications   Medication Sig    amoxicillin-pot clavulanate (AUGMENTIN) 875-125 mg Oral Tablet Take 1 Tablet by mouth Twice daily for 7 days (Patient not taking: Reported on 05/17/2022)    ASPIRIN (ASPIR-81 ORAL) Once a day    azelastine (ASTELIN) 137 mcg (0.1 %) Nasal Aerosol, Spray Administer 1 Spray into each nostril Twice daily Use in each nostril as directed    Blood Sugar Diagnostic (FREESTYLE LITE STRIPS) Strip 1 Strip Twice daily    busPIRone (BUSPAR) 5 mg Oral Tablet Take 1 Tab (5 mg total) by mouth Three times a day as needed (anxiety)    cefdinir (OMNICEF) 300 mg Oral Capsule Take 1 Capsule (300 mg total) by mouth Twice daily for 10 days    dulaglutide 4.5 mg/0.5 mL Subcutaneous Pen Injector Inject 0.5 mL (4.5 mg total) under the skin Every 7  days    escitalopram oxalate (LEXAPRO) 20 mg Oral Tablet TAKE 1 TABLET BY MOUTH EVERY DAY    flash glucose scanning reader (FREESTYLE LIBRE 14 DAY READER) Does not apply Misc For checking blood sugars 4-5 times daily.    flash glucose sensor (FREESTYLE LIBRE 14 DAY SENSOR) Does not apply Kit For checking blood sugars 4-5 times daily. On 4 insulin injections daily.    GLUCOSAM/GLUC SU/AC-ALP-D-GLUC (GLUCOSAMINE COMPLEX ORAL) Once a day    lisinopriL (PRINIVIL) 20 mg Oral Tablet TAKE 1 TABLET BY MOUTH EVERY DAY    loratadine (CLARITIN) 10 mg Oral Tablet TAKE 1 TABLET BY MOUTH EVERY DAY    metFORMIN (GLUCOPHAGE) 500 mg Oral Tablet TAKE 1 TABLET (500 MG TOTAL) BY MOUTH THREE TIMES DAILY WITH MEALS    MULTIVITAMIN (MULTIPLE VITAMINS ORAL) Once a day (Patient not taking: Reported on 03/08/2022)    pantoprazole (PROTONIX) 40 mg Oral Tablet, Delayed Release (E.C.) TAKE 1 TABLET BY MOUTH EVERY DAY    prenatal vitamin-iron-folate Tablet Take 1 Tablet by mouth Once  a day    promethazine-dextromethorphan (PHENERGAN-DM) 6.25-15 mg/5 mL Oral Syrup Take 5 mL by mouth Four times a day as needed for Cough (Patient not taking: Reported on 05/17/2022)    simvastatin (ZOCOR) 40 mg Oral Tablet TAKE 1 TABLET BY MOUTH EVERY DAY    vitamin E 100 unit Oral Capsule Take 1 Capsule (100 Units total) by mouth Once a day     No Known Allergies  Family Medical History:       Problem Relation (Age of Onset)    Breast Cancer Mother    Diabetes Mother, Maternal Grandfather    High Cholesterol Mother, Brother    Hypertension (High Blood Pressure) Mother, Father, Brother    Liver Cancer Father    Lymphoma Maternal Aunt    Melanoma Maternal Aunt    No Known Problems Sister, Maternal Grandmother, Paternal Grandmother, Paternal 19, Daughter, Son, Maternal Uncle, Paternal 21, Paternal Uncle, Other            Social History     Tobacco Use    Smoking status: Never    Smokeless tobacco: Never   Substance Use Topics    Alcohol use: Yes     Comment:  1 or 2 drinks per week    Drug use: Never       Review of Systems  Review of Systems   Constitutional:  Negative for chills and fever.   HENT:  Positive for congestion, ear pain and sinus pain. Negative for sore throat.    Respiratory:  Positive for cough. Negative for sputum production, shortness of breath and wheezing.    Cardiovascular:  Negative for chest pain.       Physical Exam:  BP 120/78   Pulse (!) 105   Temp 36.4 C (97.6 F)   Resp 18   Ht 1.575 m (_0 )   Wt 94.4 kg (208 lb 1.6 oz)   LMP  (LMP Unknown)   SpO2 99%   BMI 38.06 kg/m       Physical Exam  HENT:      Right Ear: Tympanic membrane is erythematous and bulging.      Left Ear: Tympanic membrane is not erythematous or bulging.      Nose: Congestion present.      Mouth/Throat:      Mouth: Mucous membranes are moist.      Pharynx: No posterior oropharyngeal erythema.   Cardiovascular:      Rate and Rhythm: Regular rhythm.   Pulmonary:      Effort: Pulmonary effort is normal.      Breath sounds: Normal breath sounds.   Skin:     General: Skin is warm and dry.      Capillary Refill: Capillary refill takes less than 2 seconds.   Neurological:      Mental Status: She is alert.         Assessment:    Non-recurrent acute suppurative otitis media of right ear without spontaneous rupture of tympanic membrane       Plan:  Patient Instructions   Follow up instructions:  Monitor for fever  Avoid respiratory irritants/allergens when possible.  Notify clinic if symptoms not improving next 3 days  Assure adequate rest.      Orders Placed This Encounter    cefdinir (OMNICEF) 300 mg Oral Capsule        MDM:      During the patient's visit at urgent care patient's HPI, ROS and physical exam were completed to  assist in medical decision making.    Medications were reviewed and reconciled. Medication instructions and side effects were discussed.  Advised to seek immediate medical care with any new, worsening, or concerning symptoms.  Opportunity to ask  questions was provided and all questions were answered.  Discussed diagnosis and management including indications for return, importance of close follow up and supportive care measures prior to patient discharge.       Thomasenia Bottoms, FNP-BC  05/17/2022, 08:40

## 2022-05-17 NOTE — Patient Instructions (Addendum)
Follow up instructions:  Monitor for fever  Avoid respiratory irritants/allergens when possible.  Notify clinic if symptoms not improving next 3 days  Assure adequate rest.

## 2022-05-18 MED ORDER — CIPROFLOXACIN 0.3 %-DEXAMETHASONE 0.1 % EAR DROPS,SUSPENSION
4.0000 [drp] | Freq: Two times a day (BID) | OTIC | 1 refills | Status: DC
Start: 2022-05-18 — End: 2022-06-17

## 2022-05-25 ENCOUNTER — Other Ambulatory Visit (INDEPENDENT_AMBULATORY_CARE_PROVIDER_SITE_OTHER): Payer: 59

## 2022-05-25 ENCOUNTER — Encounter (INDEPENDENT_AMBULATORY_CARE_PROVIDER_SITE_OTHER): Payer: Self-pay | Admitting: Family

## 2022-05-25 ENCOUNTER — Other Ambulatory Visit: Payer: Self-pay

## 2022-05-25 ENCOUNTER — Ambulatory Visit: Payer: 59 | Attending: Family | Admitting: Family

## 2022-05-25 VITALS — BP 132/82 | HR 86 | Temp 97.0°F | Resp 18 | Ht 62.0 in | Wt 205.2 lb

## 2022-05-25 DIAGNOSIS — J329 Chronic sinusitis, unspecified: Secondary | ICD-10-CM | POA: Insufficient documentation

## 2022-05-25 DIAGNOSIS — E119 Type 2 diabetes mellitus without complications: Secondary | ICD-10-CM

## 2022-05-25 DIAGNOSIS — Z6837 Body mass index (BMI) 37.0-37.9, adult: Secondary | ICD-10-CM | POA: Insufficient documentation

## 2022-05-25 DIAGNOSIS — J40 Bronchitis, not specified as acute or chronic: Secondary | ICD-10-CM | POA: Insufficient documentation

## 2022-05-25 DIAGNOSIS — H6691 Otitis media, unspecified, right ear: Secondary | ICD-10-CM | POA: Insufficient documentation

## 2022-05-25 LAB — HGA1C (HEMOGLOBIN A1C WITH EST AVG GLUCOSE)
ESTIMATED AVERAGE GLUCOSE: 148 mg/dL
HEMOGLOBIN A1C: 6.8 % — ABNORMAL HIGH (ref <5.7)

## 2022-05-25 MED ORDER — ALBUTEROL SULFATE HFA 90 MCG/ACTUATION AEROSOL INHALER
1.0000 | INHALATION_SPRAY | Freq: Four times a day (QID) | RESPIRATORY_TRACT | 0 refills | Status: DC | PRN
Start: 2022-05-25 — End: 2022-12-13

## 2022-05-25 MED ORDER — LEVOFLOXACIN 500 MG TABLET
500.0000 mg | ORAL_TABLET | ORAL | 0 refills | Status: DC
Start: 2022-05-25 — End: 2022-06-17

## 2022-05-25 MED ORDER — METHYLPREDNISOLONE 4 MG TABLETS IN A DOSE PACK
ORAL_TABLET | ORAL | 0 refills | Status: DC
Start: 2022-05-25 — End: 2022-06-17

## 2022-05-25 NOTE — Progress Notes (Signed)
[facility]  Operated by [facility]  Progress Note    Name: Andrea Huerta MRN:  [id number]   Date: 05/25/2022 Age: 61 y.o.         Reason for Visit: Cold Symptoms (Ongoing 2 Weeks/Wheezing, Right Ear Fullness, Cough, Drainage, Chest Congestion/OTC: Mucinex, Sudafed)    History of Present Illness  Andrea Huerta is a 61 y.o. female who is being seen today for urgent care follow up.  She presented to urgent care on 05/17/2022 for complaints of right ear pain, sinus congestion, postnasal drainage, and cough.  She was RX cefdinir.    Patient reports today that she is taking cefdinir as RX and symptoms not much better.  Complaints of nasal congestion, postnasal drainage, cough productive of yellow sputum.  Reports right ear feels plugged. She denies fevers. Deneis chest pain or dyspnea.  She has type 2 diabetes.--fasting sugars around 120  Patient does reports she took COVID test at home x2 when symptoms started    HEMOGLOBIN A1C   Date Value Ref Range Status   12/17/2021 6.7 (H) <5.7 % Final     Comment:     Hemoglobin A1c Ranges  Non-Diabetic:  <5.7  Increased Risk (pre-diabetic) for impaired glucose tolerance:  5.7 - 6.4  Consistent with diabetes (in not previously established diabetic patient:  > or = 6.5     08/17/2021 6.8 (H) 4.3 - 6.1 % Final   12/31/2020 6.6 (H) <5.7 % Final     Comment:     Hemoglobin A1c Ranges  Non-Diabetic:  <5.7  Increased Risk (pre-diabetic) for impaired glucose tolerance:  5.7 - 6.4  Consistent with diabetes (in not previously established diabetic patient:  > or = 6.5     10/14/2020 7.5 (H) 4.3 - 6.1 % Final   03/31/2020 9.6 (H) <5.7 % Final     Comment:     Hemoglobin A1c Ranges  Non-Diabetic:  <5.7  Increased Risk (pre-diabetic) for impaired glucose tolerance:  5.7 - 6.4  Consistent with diabetes (in not previously established diabetic patient:  > or = 6.5           Past Medical History:   Diagnosis Date    Diabetes mellitus, type 2  (CMS [place])     H/O complete eye exam 01/2016    H/O mammogram 2017    High cholesterol     History of dental examination 04/2016    Hypertension     Sleep apnea     Vaginal Pap smear 10/2014         Past Surgical History:   Procedure Laterality Date    HX CESAREAN SECTION      HX TONSILLECTOMY      HX WISDOM TEETH EXTRACTION      MOLE REMOVAL      several all normal         Current Outpatient Medications   Medication Sig    albuterol sulfate (PROVENTIL OR VENTOLIN OR PROAIR) 90 mcg/actuation Inhalation oral inhaler Take 1-2 Puffs by inhalation Every 6 hours as needed (dyspnea, cough, wheeze)    ASPIRIN (ASPIR-81 ORAL) Once a day    azelastine (ASTELIN) 137 mcg (0.1 %) Nasal Aerosol, Spray Administer 1 Spray into each nostril Twice daily Use in each nostril as directed    Blood Sugar Diagnostic (FREESTYLE LITE STRIPS) Strip 1 Strip Twice daily    busPIRone (BUSPAR) 5 mg Oral Tablet Take 1 Tab (5 mg total)  by mouth Three times a day as needed (anxiety)    ciprofloxacin-dexAMETHasone (CIPRODEX) 0.3-0.1 % Otic Drops, Suspension Administer 4 Drops into the left ear Twice daily for 7 days To effected ear.    dulaglutide 4.5 mg/0.5 mL Subcutaneous Pen Injector Inject 0.5 mL (4.5 mg total) under the skin Every 7 days    escitalopram oxalate (LEXAPRO) 20 mg Oral Tablet TAKE 1 TABLET BY MOUTH EVERY DAY    flash glucose scanning reader (FREESTYLE LIBRE 14 DAY READER) Does not apply Misc For checking blood sugars 4-5 times daily.    flash glucose sensor (FREESTYLE LIBRE 14 DAY SENSOR) Does not apply Kit For checking blood sugars 4-5 times daily. On 4 insulin injections daily.    GLUCOSAM/GLUC SU/AC-ALP-D-GLUC (GLUCOSAMINE COMPLEX ORAL) Once a day    levoFLOXacin (LEVAQUIN) 500 mg Oral Tablet Take 1 Tablet (500 mg total) by mouth Every 24 hours for 10 days    lisinopriL (PRINIVIL) 20 mg Oral Tablet TAKE 1 TABLET BY MOUTH EVERY DAY    loratadine (CLARITIN) 10 mg Oral Tablet TAKE 1 TABLET BY MOUTH EVERY DAY    metFORMIN  (GLUCOPHAGE) 500 mg Oral Tablet TAKE 1 TABLET (500 MG TOTAL) BY MOUTH THREE TIMES DAILY WITH MEALS    Methylprednisolone (MEDROL DOSEPACK) 4 mg Oral Tablets, Dose Pack Take as instructed.    pantoprazole (PROTONIX) 40 mg Oral Tablet, Delayed Release (E.C.) TAKE 1 TABLET BY MOUTH EVERY DAY    prenatal vitamin-iron-folate Tablet Take 1 Tablet by mouth Once a day    promethazine-dextromethorphan (PHENERGAN-DM) 6.25-15 mg/5 mL Oral Syrup Take 5 mL by mouth Four times a day as needed for Cough    simvastatin (ZOCOR) 40 mg Oral Tablet TAKE 1 TABLET BY MOUTH EVERY DAY    vitamin E 100 unit Oral Capsule Take 1 Capsule (100 Units total) by mouth Once a day     No Known Allergies  Family Medical History:       Problem Relation (Age of Onset)    Breast Cancer Mother    Diabetes Mother, Maternal Grandfather    High Cholesterol Mother, Brother    Hypertension (High Blood Pressure) Mother, Father, Brother    Liver Cancer Father    Lymphoma Maternal Aunt    Melanoma Maternal Aunt    No Known Problems Sister, Maternal Grandmother, Paternal Grandmother, Paternal Grandfather, Daughter, Son, Maternal Uncle, Paternal 84, Paternal Uncle, Other            Social History     Tobacco Use    Smoking status: Never    Smokeless tobacco: Never   Substance Use Topics    Alcohol use: Yes     Comment: 1 or 2 drinks per week    Drug use: Never       Review of Systems  Pertinent positives are listed in hpi    Physical Exam:    Vitals:    05/25/22 0751   BP: 132/82   Pulse: 86   Resp: 18   Temp: 36.1 C (97 F)   TempSrc: Thermal Scan   SpO2: 96%   Weight: 93.1 kg (205 lb 3.2 oz)   Height: 1.575 m (_0 )   BMI: 37.61          Physical Exam  Constitutional:       Appearance: Normal appearance.   HENT:      Head: Normocephalic and atraumatic.      Right Ear: Ear canal and external ear normal. Tympanic membrane is erythematous. Tympanic  membrane is not scarred, perforated, retracted or bulging.      Left Ear: Tympanic membrane, ear canal and  external ear normal.      Nose: No mucosal edema.      Right Sinus: No maxillary sinus tenderness or frontal sinus tenderness.      Left Sinus: No maxillary sinus tenderness or frontal sinus tenderness.      Mouth/Throat:      Pharynx: No pharyngeal swelling, oropharyngeal exudate, posterior oropharyngeal erythema or uvula swelling.   Eyes:      Conjunctiva/sclera: Conjunctivae normal.   Cardiovascular:      Rate and Rhythm: Normal rate and regular rhythm.      Heart sounds: Normal heart sounds. No murmur heard.  Pulmonary:      Effort: Pulmonary effort is normal. No respiratory distress.      Breath sounds: Wheezing (End inspiratory and and expiratory wheeze) present. No rales.   Abdominal:      General: There is no distension.      Tenderness: There is no abdominal tenderness. There is no right CVA tenderness or left CVA tenderness.   Lymphadenopathy:      Cervical: No cervical adenopathy.   Skin:     General: Skin is warm and dry.      Coloration: Skin is not pale.      Findings: No erythema or rash.   Neurological:      Mental Status: She is alert and oriented to person, place, and time.      Cranial Nerves: No cranial nerve deficit.      Gait: Gait is intact.   Psychiatric:         Mood and Affect: Mood and affect normal.         Behavior: Behavior normal.         Assessment/Plan:  Records from Urgent care reviewed    1. Right otitis media  Acute worsening symptoms.  Failed treatment with omnicef  Recommend levofloxacin, Medrol Dosepak, given prescription for albuterol inhaler as well.  Advised patient that if symptoms worsen over the next week or do not start improving within a week to contact the office.   - levoFLOXacin (LEVAQUIN) 500 mg Oral Tablet; Take 1 Tablet (500 mg total) by mouth Every 24 hours for 10 days  Dispense: 10 Tablet; Refill: 0  - Methylprednisolone (MEDROL DOSEPACK) 4 mg Oral Tablets, Dose Pack; Take as instructed.  Dispense: 21 Tablet; Refill: 0    2. Rhinosinusitis    - levoFLOXacin  (LEVAQUIN) 500 mg Oral Tablet; Take 1 Tablet (500 mg total) by mouth Every 24 hours for 10 days  Dispense: 10 Tablet; Refill: 0  - Methylprednisolone (MEDROL DOSEPACK) 4 mg Oral Tablets, Dose Pack; Take as instructed.  Dispense: 21 Tablet; Refill: 0    3. Bronchitis    - levoFLOXacin (LEVAQUIN) 500 mg Oral Tablet; Take 1 Tablet (500 mg total) by mouth Every 24 hours for 10 days  Dispense: 10 Tablet; Refill: 0  - Methylprednisolone (MEDROL DOSEPACK) 4 mg Oral Tablets, Dose Pack; Take as instructed.  Dispense: 21 Tablet; Refill: 0  - albuterol sulfate (PROVENTIL OR VENTOLIN OR PROAIR) 90 mcg/actuation Inhalation oral inhaler; Take 1-2 Puffs by inhalation Every 6 hours as needed (dyspnea, cough, wheeze)  Dispense: 1 Each; Refill: 0    4. Type 2 diabetes mellitus without complication, without long-term current use of insulin (CMS HCC)  Patient given prescription for Medrol pack.  Advised this may elevate sugars.  Advised sugars should  normalize once she completes the pack.  Advised patient if sugars elevate to 300 or more she should stop taking the Medrol and contact the office.    5. BMI 37.0-37.9, adult           Orders Placed This Encounter    levoFLOXacin (LEVAQUIN) 500 mg Oral Tablet    Methylprednisolone (MEDROL DOSEPACK) 4 mg Oral Tablets, Dose Pack    albuterol sulfate (PROVENTIL OR VENTOLIN OR PROAIR) 90 mcg/actuation Inhalation oral inhaler      New medications that were prescribed in this encounter were discussed with the patient. All questions and concerns about the new medication were addressed.                       Follow up: Return if symptoms worsen or fail to improve.    Seek medical attention for new or worsening symptoms.    Patient has been seen in this clinic within the last 3 years.     Georga Hacking, FNP-BC     This note may have been partially generated using MModal Fluency Direct system, and there may be some incorrect words, spellings, and punctuation that were not noted in checking the note  before saving, though effort was made to avoid such errors.

## 2022-05-25 NOTE — Result Encounter Note (Signed)
Please inform the patient that her hemoglobin A1c is 6.8% showing excellent diabetic control.  A making no change to her diabetic management.      I see that she is been started on new antibiotic.  She is to let us know she does not feel significant improvement in the next few days.      TY TH

## 2022-05-25 NOTE — Nursing Note (Signed)
05/25/22 0753   Health Education and Literacy   How often do you have a problem understanding what is told to you about your medical condition?  Never   Domestic Violence   Because we are aware of abuse and domestic violence today, we ask all patients: Are you being hurt, hit, or frightened by anyone at your home or in your life?  N   Basic Needs   Do you have any basic needs within your home that are not being met? (such as Food, Shelter, Games developer, Tranportation, paying for bills and/or medications) N   Advanced Directives   Do you have any advanced directives? No Advance   Would you like an advanced directive packet? Accepted Packet

## 2022-05-25 NOTE — Ancillary Notes (Signed)
[place]    Venipuncture performed in office on right arm antecubital vein, dry pressure dressing was applied to site and patient tolerated it well.  Specimen was centrifuged, aliquoted as needed and specimen was labeled and packaged for transport.    Boone Master  05/25/2022, 08:36

## 2022-06-16 ENCOUNTER — Other Ambulatory Visit: Payer: Self-pay

## 2022-06-17 ENCOUNTER — Ambulatory Visit (INDEPENDENT_AMBULATORY_CARE_PROVIDER_SITE_OTHER): Payer: 59 | Admitting: Family Medicine

## 2022-06-17 ENCOUNTER — Encounter (INDEPENDENT_AMBULATORY_CARE_PROVIDER_SITE_OTHER): Payer: Self-pay | Admitting: Family Medicine

## 2022-06-17 VITALS — BP 136/82 | HR 97 | Temp 98.4°F | Resp 16 | Ht 62.0 in | Wt 205.0 lb

## 2022-06-17 DIAGNOSIS — Z7984 Long term (current) use of oral hypoglycemic drugs: Secondary | ICD-10-CM

## 2022-06-17 DIAGNOSIS — Z1231 Encounter for screening mammogram for malignant neoplasm of breast: Secondary | ICD-10-CM

## 2022-06-17 DIAGNOSIS — Z7985 Long-term (current) use of injectable non-insulin antidiabetic drugs: Secondary | ICD-10-CM

## 2022-06-17 DIAGNOSIS — H66004 Acute suppurative otitis media without spontaneous rupture of ear drum, recurrent, right ear: Secondary | ICD-10-CM

## 2022-06-17 DIAGNOSIS — E119 Type 2 diabetes mellitus without complications: Secondary | ICD-10-CM

## 2022-06-17 DIAGNOSIS — F418 Other specified anxiety disorders: Secondary | ICD-10-CM

## 2022-06-17 MED ORDER — LORAZEPAM 0.5 MG TABLET
0.5000 mg | ORAL_TABLET | Freq: Two times a day (BID) | ORAL | 1 refills | Status: DC
Start: 2022-06-17 — End: 2022-12-13

## 2022-06-17 NOTE — Nursing Note (Signed)
06/17/22 1445   Depression Screen   Little interest or pleasure in doing things. 0   Feeling down, depressed, or hopeless 1   PHQ 2 Total 1

## 2022-06-17 NOTE — Progress Notes (Signed)
[facility]  [place] [address]       Name: Andrea Huerta MRN:  [id number]   Date: 06/17/2022 Age: 61 y.o.     Chief complaint: The patient is a 61 y.o. old female who came in today for Diabetes (Last A1C 05/25/22 -- 6.8% ) and Ear Fullness     HPI:     Diabetes: Last HbA1C was 6.8%.  Patient denies symptoms of polydipsia, polyuria, hypoglycemic unawareness, erectile dysfunction, chest pain, dyspnea on exertion, diarrhea, foot ulcerations, paresthesias. Symptoms are not changed. Patient's home blood sugars are controlled. Patient is compliant with medications.  She is currently prescribed Trulicity, and metformin.  She is had no recent side effects of Trulicity.    Anxiety: Patient complains of fewer symptoms of racing thoughts, feelings of losing control. She states that symptoms have been present for the past several years. Course to date has been well controlled. She denies current suicidal and homicidal ideation. No major difficulty in behaviors is reported. GAD-7 reviewed with patient in room. Patient is not currently involved with psychiatry or counseling.  She is currently prescribed an SSRI with good results.    Ear fullness:  > 3 months. Has been treated x3 for otitis media.  She has been treated with both Augmentin and Levaquin and another antibiotic which she does not recall.  Has no fever at this time.  Does have popping and cracking of her inner ear.  She has been on CPAP recently with humidifier.  She has a significant amount of moisture in drainage associated with the airflow.      ROS:  Review of systems completed with all positives and pertinent negatives as per HPI.  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  Has significant vaginal thinning.  Would be interested in treatment.  Otherwise, negative.    Past medical history:  Past Medical History:   Diagnosis Date    Diabetes mellitus, type 2 (CMS [place])     H/O complete eye exam 01/2016    H/O  mammogram 2017    High cholesterol     History of dental examination 04/2016    Hypertension     Sleep apnea     Vaginal Pap smear 10/2014     Past surgical history:  Past Surgical History:   Procedure Laterality Date    HX CESAREAN SECTION      HX TONSILLECTOMY      HX WISDOM TEETH EXTRACTION      MOLE REMOVAL      several all normal     Medications:  Current Outpatient Medications   Medication Sig    albuterol sulfate (PROVENTIL OR VENTOLIN OR PROAIR) 90 mcg/actuation Inhalation oral inhaler Take 1-2 Puffs by inhalation Every 6 hours as needed (dyspnea, cough, wheeze)    ASPIRIN (ASPIR-81 ORAL) Once a day    azelastine (ASTELIN) 137 mcg (0.1 %) Nasal Aerosol, Spray Administer 1 Spray into each nostril Twice daily Use in each nostril as directed    Blood Sugar Diagnostic (FREESTYLE LITE STRIPS) Strip 1 Strip Twice daily    busPIRone (BUSPAR) 5 mg Oral Tablet Take 1 Tab (5 mg total) by mouth Three times a day as needed (anxiety)    dulaglutide 4.5 mg/0.5 mL Subcutaneous Pen Injector Inject 0.5 mL (4.5 mg total) under the skin Every 7 days    escitalopram oxalate (LEXAPRO) 20 mg Oral Tablet TAKE 1 TABLET BY MOUTH EVERY DAY    flash glucose  scanning reader (FREESTYLE LIBRE 14 DAY READER) Does not apply Misc For checking blood sugars 4-5 times daily.    flash glucose sensor (FREESTYLE LIBRE 14 DAY SENSOR) Does not apply Kit For checking blood sugars 4-5 times daily. On 4 insulin injections daily.    fluticasone propionate (FLONASE) 50 mcg/actuation Nasal Spray, Suspension Administer 1 Spray into each nostril Once a day    GLUCOSAM/GLUC SU/AC-ALP-D-GLUC (GLUCOSAMINE COMPLEX ORAL) Once a day    lisinopriL (PRINIVIL) 20 mg Oral Tablet TAKE 1 TABLET BY MOUTH EVERY DAY    loratadine (CLARITIN) 10 mg Oral Tablet TAKE 1 TABLET BY MOUTH EVERY DAY    LORazepam (ATIVAN) 0.5 mg Oral Tablet Take 1 Tablet (0.5 mg total) by mouth Twice daily Prior to medical procedures.    metFORMIN (GLUCOPHAGE) 500 mg Oral Tablet Take 2 Tablets  (1,000 mg total) by mouth Every evening with dinner    pantoprazole (PROTONIX) 40 mg Oral Tablet, Delayed Release (E.C.) TAKE 1 TABLET BY MOUTH EVERY DAY    prenatal vitamin-iron-folate Tablet Take 1 Tablet by mouth Once a day    promethazine-dextromethorphan (PHENERGAN-DM) 6.25-15 mg/5 mL Oral Syrup Take 5 mL by mouth Four times a day as needed for Cough    simvastatin (ZOCOR) 40 mg Oral Tablet TAKE 1 TABLET BY MOUTH EVERY DAY    vitamin E 100 unit Oral Capsule Take 1 Capsule (100 Units total) by mouth Once a day     Allergies:  No Known Allergies     Family history:  Family Medical History:       Problem Relation (Age of Onset)    Breast Cancer Mother    Diabetes Mother, Maternal Grandfather    High Cholesterol Mother, Brother    Hypertension (High Blood Pressure) Mother, Father, Brother    Liver Cancer Father    Lymphoma Maternal Aunt    Melanoma Maternal Aunt    No Known Problems Sister, Maternal Grandmother, Paternal Grandmother, Paternal Grandfather, Daughter, Son, Maternal Uncle, Paternal 34, Paternal Uncle, Other          Social history:  Social History     Socioeconomic History    Marital status: Married   Occupational History    Occupation: math Pharmacist, 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    Physical Examination:    GENERAL: Pt is a pleasant, well-nourished, well-developed 60 y.o. female who is in NAD. Appears stated age  47:  head normocephalic, symmetrical facies. EOM intact b/l. PERRLA. Sclera non-icteric, non-injected.  Right auditory canal is clear.  The tympanic mend brain is reddened but landmarks are not completely lost.  No perforation or drainage  is noted.  No rhinorrhea. Oropharyngeal mucous membranes are pink and moist.    NECK: supple. No masses, lymphadenopathy, JVD on exam. Trachea midline, no thyromegaly   CV: S1, S2. No murmurs, rubs, gallops.     LUNGS: CTAB, No  rhonchi, rales, wheezes.   GI: (+) BS in all 4 quadrants. soft, NT/ND. No rigidity/ guarding/ rebound. No organomegaly/masses, or abdominal bruits  MSK: Spontaneous normal AROM of major joints as observed during exam. No joint effusions/ swelling/ deformities.   No BLE edema, clubbing, or cyanosis.   2+ radial pulse present b/l.   + 2 reflexes Brachioradialis and patellar bilaterally.   Gait normal.  SKIN: No significant lesions, rashes, ecchymoses noted.  Multiple skin tags appreciated about the neckline bilaterally.    NEURO: AAOx4, CN grossly intact. Sensation intact b/l. No focal deficits.  PSYCH: Mood, behavior and affect normal w/ Intact judgement & insight   Exam stable.       06/17/22 1445   Depression Screen   Little interest or pleasure in doing things. 0   Feeling down, depressed, or hopeless 1   PHQ 2 Total 1       Diabetes Monitors  A1C: 6.8  A1C Date: 05/25/2022           Nephropathy Screening: On ACEI or ARB  Lab Results   Component Value Date    CHOLESTEROL 165 12/17/2021    HDLCHOL 48 (L) 12/17/2021    LDLCHOL 95 12/17/2021    TRIG 125 12/17/2021     Retinal Exam Date: 06/27/2020  Last diabetic foot exam: Not Found       Visit Diagnosis    Encounter Diagnoses   Name Primary?    Recurrent acute suppurative otitis media of right ear without spontaneous rupture of tympanic membrane Yes    Anxiety about health     Breast cancer screening by mammogram     Type 2 diabetes mellitus without complication, without long-term current use of insulin (CMS HCC)      Orders Placed This Encounter    CT SINUSES WO IV CONTRAST    MAMMO BILATERAL SCREENING-ADDL VIEWS/BREAST US AS REQ BY RAD    LORazepam (ATIVAN) 0.5 mg Oral Tablet     Type 2 diabetes mellitus without complication, without  long-term current use of insulin (CMS HCC)  Chronic, improved control  Continues to trend down from 9.7 to  6.5% in December of 2023.  Continue improved compliance with diabetic diet.  Continue decrease carbohydrate and sugar intake.   Continue structured exercise/ activity for weight loss.   Check blood sugars only as needed and for reference for her dietary changes.   Continue metformin 500 mg to take b.i.d..  Will increase Trulicity.  To 4.5 mg q.week.  Continue lisinopril 20 mg daily.   Continue simvastatin 40 mg daily.   Will need foot exam. -she is being referred to Podiatry.  (patient has flat warts or early corns developing as well)  Patient follows with ophthalmologist annually.     Essential hypertension  Chronic, now controlled.  Encouraged lifestyle modifications.   Continue behavioral efforts, esp  Cardiac friendly diet,   < 2 gm salt q day.   Exercise 20 min q day most days of the week.  Continue ACEI - lisinopril 20 mg daily.   Labs ordered.     Anxiety and depression  Chronic, controlled with medication.   No SI/HI thoughts voiced at this time.   Continue Lexapro 20 mg.  Continue BuSpar 5 mg TID PRN for breakthrough anxiety.     Vaginal itching-improved.   Consider atrophic vaginitis   Will start estrogen supplementation after   Reassuring mammogram.  Her last Pap was 03/31/2020.  She has not had a hysterectomy.  Otitis media right side:  Has returned.  No cerumen appreciated.  Will obtain CT to rule out obstructive sources.  Consider dental sources.    Screening for HIV (human immunodeficiency virus)  Encounter for hepatitis C virus screening test for high risk patient  Labs previously ordered.  Discussed HIV and HCV screening with patient in office.   Ordered with labs.      Screening for breast cancer  Mammogram re-ordered.     Return in about 6 months (around 12/17/2022), or if symptoms worsen or fail to improve, for In Person Visit, chronic disease management.    Cherly Beach,  MD  06/17/2022 15:24     Electronically signed by Cherly Beach, MD    This note was partially generated using MModal Fluency Direct system, and there may be some incorrect words, spellings, and punctuation that were not noted in checking the note before saving.

## 2022-06-18 ENCOUNTER — Inpatient Hospital Stay
Admission: RE | Admit: 2022-06-18 | Discharge: 2022-06-18 | Disposition: A | Discharge status: Home / Self Care | Payer: 59 | Source: Ambulatory Visit | Attending: Family Medicine | Admitting: Family Medicine

## 2022-06-18 ENCOUNTER — Encounter (HOSPITAL_BASED_OUTPATIENT_CLINIC_OR_DEPARTMENT_OTHER): Payer: Self-pay

## 2022-06-18 ENCOUNTER — Other Ambulatory Visit: Payer: Self-pay

## 2022-06-18 ENCOUNTER — Encounter (INDEPENDENT_AMBULATORY_CARE_PROVIDER_SITE_OTHER): Payer: Self-pay | Admitting: Family Medicine

## 2022-06-18 DIAGNOSIS — Z1231 Encounter for screening mammogram for malignant neoplasm of breast: Secondary | ICD-10-CM | POA: Insufficient documentation

## 2022-06-18 NOTE — Result Encounter Note (Signed)
Please inform the patient that her mammogram has been resulted.  There are no worrisome masses or lesions.      The radiologist recommended a repeat in 1 year.      Will review this together at our next appointment.      TY TH

## 2022-06-24 ENCOUNTER — Encounter (INDEPENDENT_AMBULATORY_CARE_PROVIDER_SITE_OTHER): Payer: Self-pay | Admitting: Family Medicine

## 2022-06-25 NOTE — Telephone Encounter (Signed)
-----   Message from [name] sent at 06/24/2022  7:47 PM EST -----  Regarding: Referral   Contact: [phone]  Can I get a referral to an ENT?  Suezanne Jacquet and Janett Billow took [place] to a Dr. Melody Haver in [place] and liked him. I am open to suggestions.     Margarita Grizzle

## 2022-06-27 ENCOUNTER — Other Ambulatory Visit (INDEPENDENT_AMBULATORY_CARE_PROVIDER_SITE_OTHER): Payer: Self-pay | Admitting: Family Medicine

## 2022-06-27 DIAGNOSIS — H65194 Other acute nonsuppurative otitis media, recurrent, right ear: Secondary | ICD-10-CM

## 2022-06-27 NOTE — Telephone Encounter (Signed)
Regarding: Referral   Contact: [phone]  ----- Message from Rochele Pages, LPN sent at 10/26/9726  4:41 PM EST -----       ----- Message from Lawernce Ion to Cherly Beach, MD sent at 06/24/2022  7:47 PM -----   Can I get a referral to an ENT?  Suezanne Jacquet and Janett Billow took [place] to a Dr. Melody Haver in [place] and liked him. I am open to suggestions.     Andrea Huerta

## 2022-06-27 NOTE — Telephone Encounter (Signed)
I have placed an order for an external referral.      The only reason we would select a different ENT would be for time.  If the appointment can not be made in [place] in the near term please reschedule for an ENT at [facility], Dr. Fortunato Curling.     TY TH

## 2022-06-28 ENCOUNTER — Encounter (INDEPENDENT_AMBULATORY_CARE_PROVIDER_SITE_OTHER): Payer: Self-pay | Admitting: Family Medicine

## 2022-08-09 ENCOUNTER — Encounter (INDEPENDENT_AMBULATORY_CARE_PROVIDER_SITE_OTHER): Payer: Self-pay | Admitting: Family Medicine

## 2022-08-10 ENCOUNTER — Telehealth (INDEPENDENT_AMBULATORY_CARE_PROVIDER_SITE_OTHER): Payer: Self-pay | Admitting: Family Medicine

## 2022-08-10 NOTE — Telephone Encounter (Signed)
Joni,    Since she is within 48 hours of COVID we could start Paxlovid.  You can go ahead and send that to her pharmacy if her renal function will allow.      She can stop the antibiotic.    We should avoid steroids unless her O2 sat staying less than 92%.    We should write her a work slip for the next 5 days.      TY TH

## 2022-09-13 ENCOUNTER — Other Ambulatory Visit (HOSPITAL_BASED_OUTPATIENT_CLINIC_OR_DEPARTMENT_OTHER): Payer: Self-pay | Admitting: Family Medicine

## 2022-09-13 DIAGNOSIS — I10 Essential (primary) hypertension: Secondary | ICD-10-CM

## 2022-09-13 NOTE — Telephone Encounter (Signed)
Last scheduled appointment with you was 06/17/2022.  Currently scheduled future appointment is 12/13/22.    [place], [place]  09/13/2022, 08:12

## 2022-09-14 NOTE — Telephone Encounter (Signed)
Last scheduled appointment with you was 06/17/2022.  Currently scheduled future appointment is 12/13/22.    [place], [place]  09/14/2022, 08:08

## 2022-10-04 ENCOUNTER — Other Ambulatory Visit (HOSPITAL_BASED_OUTPATIENT_CLINIC_OR_DEPARTMENT_OTHER): Payer: Self-pay | Admitting: Family Medicine

## 2022-10-04 DIAGNOSIS — F419 Anxiety disorder, unspecified: Secondary | ICD-10-CM

## 2022-10-05 NOTE — Telephone Encounter (Signed)
Last scheduled appointment with you was 06/17/2022.  Currently scheduled future appointment is 12/13/22.    Camila Li, [place]  10/05/2022, 07:59

## 2022-10-06 ENCOUNTER — Encounter (HOSPITAL_BASED_OUTPATIENT_CLINIC_OR_DEPARTMENT_OTHER): Payer: Self-pay | Admitting: OTOLARYNGOLOGY

## 2022-10-28 ENCOUNTER — Ambulatory Visit (HOSPITAL_COMMUNITY): Payer: Self-pay | Admitting: OTOLARYNGOLOGY

## 2022-10-28 ENCOUNTER — Other Ambulatory Visit: Payer: Self-pay

## 2022-10-28 ENCOUNTER — Encounter (HOSPITAL_BASED_OUTPATIENT_CLINIC_OR_DEPARTMENT_OTHER): Payer: Self-pay | Admitting: OTOLARYNGOLOGY

## 2022-10-28 ENCOUNTER — Ambulatory Visit: Payer: 59 | Attending: OTOLARYNGOLOGY | Admitting: OTOLARYNGOLOGY

## 2022-10-28 VITALS — Ht 62.0 in | Wt 200.0 lb

## 2022-10-28 DIAGNOSIS — J309 Allergic rhinitis, unspecified: Secondary | ICD-10-CM | POA: Insufficient documentation

## 2022-10-28 DIAGNOSIS — J329 Chronic sinusitis, unspecified: Secondary | ICD-10-CM

## 2022-10-28 DIAGNOSIS — Z7952 Long term (current) use of systemic steroids: Secondary | ICD-10-CM

## 2022-10-28 DIAGNOSIS — Z792 Long term (current) use of antibiotics: Secondary | ICD-10-CM

## 2022-10-28 DIAGNOSIS — Z79899 Other long term (current) drug therapy: Secondary | ICD-10-CM

## 2022-10-28 DIAGNOSIS — H9190 Unspecified hearing loss, unspecified ear: Secondary | ICD-10-CM | POA: Insufficient documentation

## 2022-10-28 DIAGNOSIS — H9191 Unspecified hearing loss, right ear: Secondary | ICD-10-CM

## 2022-10-28 DIAGNOSIS — H938X1 Other specified disorders of right ear: Secondary | ICD-10-CM | POA: Insufficient documentation

## 2022-10-28 MED ORDER — CEFDINIR 300 MG CAPSULE
300.0000 mg | ORAL_CAPSULE | Freq: Two times a day (BID) | ORAL | 0 refills | Status: DC
Start: 2022-10-28 — End: 2022-12-13

## 2022-10-28 MED ORDER — CETIRIZINE 10 MG CHEWABLE TABLET
10.0000 mg | CHEWABLE_TABLET | Freq: Every day | ORAL | 3 refills | Status: DC
Start: 2022-10-28 — End: 2024-02-01

## 2022-10-28 MED ORDER — METHYLPREDNISOLONE 4 MG TABLETS IN A DOSE PACK
ORAL_TABLET | ORAL | 0 refills | Status: DC
Start: 2022-10-28 — End: 2022-12-13

## 2022-10-28 MED ORDER — FLUTICASONE PROPIONATE 50 MCG/ACTUATION NASAL SPRAY,SUSPENSION
2.0000 | Freq: Every day | NASAL | 11 refills | Status: DC
Start: 2022-10-28 — End: 2023-12-07

## 2022-10-28 NOTE — Procedures (Signed)
ENT, [facility]  [address]  [place] [address]  Operated by [facility]  Procedure Note    Name: Caneshia Goughnour MRN:  [id number]   Date: 10/28/2022 DOB:  [birth year] (61 y.o.)           31231 - NASAL ENDOSCOPY DIAGNOSTIC UNILATERAL OR BILATERAL (AMB ONLY)    Performed by: Vivia Ewing, MD  Authorized by: Vivia Ewing, MD        Procedure:  Nasal Endoscopy  Indications: To visualize anatomy in specific detail.  Anesthesia:  Topical Afrin and Pontocaine  Procedure:     A flexible nasal endoscope was used to visualize both nasal cavities, with examination of the septum, inferior meatus, middle meatus, superior meatus, and nasopharynx. Septum is deviated to the right posteriorly with a septal spur.. Nasal mucosa appeared healthy. No polyps were identified. No pus was visualized. No granulation tissue was identified.  Nasopharynx was clear. Eustachian tube orifices were normal. The patient tolerated the procedure well.     Vivia Ewing, MD

## 2022-10-28 NOTE — Progress Notes (Signed)
ENT, [facility]  [address]  [place] [address]  Operated by [facility]  New Patient Visit    Name: Andrea Huerta MRN:  [id number]   Date: 10/28/2022 DOB: January 27, 1961 (62 y.o.)       Referring Provider:    No referring provider defined for this encounter.    Reason for Visit:   Chief Complaint   Patient presents with    Ear Pain    Nasal Congestion    Nasal Drainage        History of Present Illness:  Andrea Huerta is a 62 y.o. female who presents today with complaint of nasal congestion, copious postnasal drainage and intermittent fullness of the right ear.  She is underwent evaluation elsewhere which showed normal exam.  She was evaluated by a PA at [facility] in [place].  Report was a mild hearing loss in the right ear.  She does report tinnitus bilateral when the room is quiet.      Patient History:  Patient Active Problem List   Diagnosis    Type 2 diabetes mellitus without complication, without long-term current use of insulin (CMS HCC)    Essential hypertension    Morbid obesity with BMI of 40.0-44.9, adult (CMS HCC)    Hyperlipidemia    Anxiety and depression    OSA on CPAP     Current Outpatient Medications   Medication Sig    albuterol sulfate (PROVENTIL OR VENTOLIN OR PROAIR) 90 mcg/actuation Inhalation oral inhaler Take 1-2 Puffs by inhalation Every 6 hours as needed (dyspnea, cough, wheeze) (Patient not taking: Reported on 10/28/2022)    ASPIRIN (ASPIR-81 ORAL) Once a day    azelastine (ASTELIN) 137 mcg (0.1 %) Nasal Aerosol, Spray Administer 1 Spray into each nostril Twice daily Use in each nostril as directed (Patient not taking: Reported on 10/28/2022)    Blood Sugar Diagnostic (FREESTYLE LITE STRIPS) Strip 1 Strip Twice daily    busPIRone (BUSPAR) 5 mg Oral Tablet Take 1 Tab (5 mg total) by mouth Three times a day as needed (anxiety) (Patient not taking: Reported on 10/28/2022)    cefdinir (OMNICEF) 300 mg Oral Capsule Take 1 Capsule (300 mg  total) by mouth Twice daily for 20 days    cetirizine (ZYRTEC) 10 mg Oral Tablet, Chewable Chew 1 Tablet (10 mg total) Once a day    dulaglutide 4.5 mg/0.5 mL Subcutaneous Pen Injector Inject 0.5 mL (4.5 mg total) under the skin Every 7 days    escitalopram oxalate (LEXAPRO) 20 mg Oral Tablet TAKE 1 TABLET BY MOUTH EVERY DAY    flash glucose scanning reader (FREESTYLE LIBRE 14 DAY READER) Does not apply Misc For checking blood sugars 4-5 times daily.    flash glucose sensor (FREESTYLE LIBRE 14 DAY SENSOR) Does not apply Kit For checking blood sugars 4-5 times daily. On 4 insulin injections daily.    fluticasone propionate (FLONASE) 50 mcg/actuation Nasal Spray, Suspension Administer 1 Spray into each nostril Once a day    fluticasone propionate (FLONASE) 50 mcg/actuation Nasal Spray, Suspension Administer 2 Sprays into each nostril Once a day    GLUCOSAM/GLUC SU/AC-ALP-D-GLUC (GLUCOSAMINE COMPLEX ORAL) Once a day    lisinopriL (PRINIVIL) 20 mg Oral Tablet TAKE 1 TABLET BY MOUTH EVERY DAY    loratadine (CLARITIN) 10 mg Oral Tablet TAKE 1 TABLET BY MOUTH EVERY DAY (Patient not taking: Reported on 10/28/2022)    LORazepam (ATIVAN) 0.5 mg Oral Tablet Take 1  Tablet (0.5 mg total) by mouth Twice daily Prior to medical procedures. (Patient not taking: Reported on 10/28/2022)    metFORMIN (GLUCOPHAGE) 500 mg Oral Tablet Take 2 Tablets (1,000 mg total) by mouth Every evening with dinner    Methylprednisolone (MEDROL DOSEPACK) 4 mg Oral Tablets, Dose Pack Take as instructed.    pantoprazole (PROTONIX) 40 mg Oral Tablet, Delayed Release (E.C.) TAKE 1 TABLET BY MOUTH EVERY DAY    prenatal vitamin-iron-folate Tablet Take 1 Tablet by mouth Once a day    promethazine-dextromethorphan (PHENERGAN-DM) 6.25-15 mg/5 mL Oral Syrup Take 5 mL by mouth Four times a day as needed for Cough (Patient not taking: Reported on 10/28/2022)    simvastatin (ZOCOR) 40 mg Oral Tablet TAKE 1 TABLET BY MOUTH EVERY DAY    vitamin E 100 unit Oral Capsule Take  1 Capsule (100 Units total) by mouth Once a day     No Known Allergies  Past Medical History:   Diagnosis Date    Diabetes mellitus, type 2 (CMS HCC)     H/O complete eye exam 01/2016    H/O mammogram 2017    High cholesterol     History of dental examination 04/2016    Hypertension     Sleep apnea     Vaginal Pap smear 10/2014      Past Surgical History:   Procedure Laterality Date    HX CESAREAN SECTION      HX TONSILLECTOMY      HX WISDOM TEETH EXTRACTION      MOLE REMOVAL      several all normal      Family Medical History:       Problem Relation (Age of Onset)    Breast Cancer Mother    Diabetes Mother, Maternal Grandfather    High Cholesterol Mother, Brother    Hypertension (High Blood Pressure) Mother, Father, Brother    Liver Cancer Father    Lymphoma Maternal Aunt    Melanoma Maternal Aunt    No Known Problems Sister, Maternal Grandmother, Paternal Grandmother, Paternal Grandfather, Daughter, Son, Maternal Uncle, Paternal Aunt, Paternal Uncle, Other            Social History     Tobacco Use    Smoking status: Never    Smokeless tobacco: Never   Substance Use Topics    Alcohol use: Yes     Comment: 1 or 2 drinks per week    Drug use: Never       Review of Systems:  The pertinent ROS as addressed in the HPI.    Physical Exam:  Ht 1.575 m (5\' 2" )   Wt 90.7 kg (200 lb)   LMP  (LMP Unknown)   BMI 36.58 kg/m         ENT Physical Exam  Ear  Auricles: right auricle normal; left auricle normal;  Ear Canals: right ear canal normal; left ear canal normal;  Tympanic Membranes: right tympanic membrane normal; left tympanic membrane normal;  Nose  External Nose: nares patent bilaterally; external nose normal;  Nose comments: See nasal endoscopy no for detailed intranasal examination  Oral Cavity/Oropharynx  Lips: normal;  Teeth: normal;  Gums: gingiva normal;  Tongue: normal;  Oral mucosa: normal;  Hard palate: normal;  Soft palate: normal;  Tonsils: normal;  Neck  Neck: neck normal; neck palpation normal;  Thyroid:  thyroid normal;  Respiratory  Inspection: breathing unlabored;  Lymphatic  Palpation: lymph nodes normal;  Neurovestibular  Mental Status: alert and oriented;  Psychiatric: affect is appropriate;  Cranial Nerves: cranial nerves intact;       Assessment:  ENCOUNTER DIAGNOSES     ICD-10-CM   1. Chronic sinusitis, unspecified  J32.9   2. Hearing loss, unspecified hearing loss type, unspecified laterality  H91.90   3. Allergic rhinitis, unspecified seasonality, unspecified trigger  J30.9   4. Ear fullness, right  H93.8X1     Her audiogram showed normal hearing bilaterally.  There was a flat right tympanogram however the ear microscopy exam showed no middle ear fluid.  Plan:  Medical records reviewed on 10/28/2022.  I reviewed the CT of the sinuses with her which showed bilateral ethmoid mucosal thickening and mucus in the right maxillary sinus.  Will treat with cefdinir for 20 days, Medrol Dosepak, Flonase and Zyrtec.  Return visit in a proximally 2 months or sooner if not improving.  Orders Placed This Encounter    [phone] - NASAL ENDOSCOPY DIAGNOSTIC UNILATERAL OR BILATERAL (AMB ONLY)    [place] [address] SINUS SCAN    Referral to AUDIOLOGY- [facility]    cefdinir (OMNICEF) 300 mg Oral Capsule    Methylprednisolone (MEDROL DOSEPACK) 4 mg Oral Tablets, Dose Pack    fluticasone propionate (FLONASE) 50 mcg/actuation Nasal Spray, Suspension    cetirizine (ZYRTEC) 10 mg Oral Tablet, Chewable       Vivia Ewing, MD

## 2022-10-28 NOTE — Nursing Note (Signed)
Pt is here to have ears checked.

## 2022-11-02 ENCOUNTER — Encounter (HOSPITAL_BASED_OUTPATIENT_CLINIC_OR_DEPARTMENT_OTHER): Payer: Self-pay | Admitting: OTOLARYNGOLOGY

## 2022-11-14 ENCOUNTER — Encounter (INDEPENDENT_AMBULATORY_CARE_PROVIDER_SITE_OTHER): Payer: Self-pay | Admitting: Family Medicine

## 2022-11-17 NOTE — Telephone Encounter (Signed)
Patient original message--     I am due to pick-up my Trulicity prescription.  I would like to discuss if any other diabetes medicine can help me with weight loss.    Jacki Cones    Current medications -   glucophage/metformin   Trulicity           Recommendations?       Egbert Garibaldi, LPN

## 2022-11-17 NOTE — Telephone Encounter (Signed)
-----   Message from Clayton Lefort sent at 11/16/2022  3:13 PM EDT -----  Regarding: Trulicity refill  Contact: [phone]  Just checking back in, I will need to pick up the Trulicity by Saturday.

## 2022-11-18 ENCOUNTER — Other Ambulatory Visit (HOSPITAL_BASED_OUTPATIENT_CLINIC_OR_DEPARTMENT_OTHER): Payer: Self-pay | Admitting: Family Medicine

## 2022-11-18 DIAGNOSIS — K219 Gastro-esophageal reflux disease without esophagitis: Secondary | ICD-10-CM

## 2022-11-18 NOTE — Telephone Encounter (Signed)
Last scheduled appointment with you was 06/17/2022.  Currently scheduled future appointment is 12/13/22      Egbert Garibaldi, LPN  7/82/9562, 12:56

## 2022-11-19 ENCOUNTER — Other Ambulatory Visit (INDEPENDENT_AMBULATORY_CARE_PROVIDER_SITE_OTHER): Payer: Self-pay | Admitting: Family Medicine

## 2022-11-19 DIAGNOSIS — E119 Type 2 diabetes mellitus without complications: Secondary | ICD-10-CM

## 2022-11-19 NOTE — Telephone Encounter (Signed)
Last scheduled appointment with you was 06/17/2022.  Currently scheduled future appointment is 12/13/2022.      Egbert Garibaldi, LPN  1/61/0960, 09:15

## 2022-11-23 ENCOUNTER — Telehealth (INDEPENDENT_AMBULATORY_CARE_PROVIDER_SITE_OTHER): Payer: Self-pay | Admitting: Family Medicine

## 2022-11-23 NOTE — Telephone Encounter (Signed)
Regarding: Trulicity refill  Contact: [phone]  ----- Message from Egbert Garibaldi, LPN sent at 10/24/2128  2:03 PM EDT -----       ----- Message from Hillard Danker to Egbert Garibaldi, LPN sent at 01/25/5783  4:48 PM -----   I was unable to fill my Trulicity 4.5 prescription in the MOV. Called many pharmacies.  Lucky I had some 3.0 left and I took that dosage in place of my 4.5.  I have 3 more weeks left of the 3.0.     Please advise.    Andrea Huerta      ----- Message -----       From:Joni D       Sent:11/19/2022  2:41 PM EDT         ON:[id number] Andrea Huerta    Subject:Trulicity refill      That Trulicity works the same as the other medications.  We can try switching over to Ozempic at next refill but I would not expect any great improvement verses Trulicity.      The newer agents are only indicated for weight loss, and will not be covered for diabetes.       Sorry Andrea Huerta, I was a step ahead of him replying to you.   His recommendation is above.       ----- Message -----       From:Maki Andrea Huerta       Sent:11/19/2022  2:08 PM EDT         UX:[id number] Advice    Subject:Trulicity refill    I wanted this to be consided before I refill the Trulicity       ----- Message -----       From:Tatym Andrea Huerta       Sent:11/19/2022  2:07 PM EDT         UV:[id number] Advice    Subject:Trulicity refill    My original message was to see if I can be switched to injectable that will also help with weight loss.      ----- Message -----       From:Joni D       Sent:11/19/2022  1:35 PM EDT         IH:[id number] Andrea Huerta    Subject:Trulicity refill    This was sent in for you.   Sorry about the delay!   Have a good day.   Thank You,   Joni- LPN       ----- Message -----       From:Oliwia Andrea Huerta       Sent:11/18/2022 10:35 PM EDT         DG:[id number] Advice    Subject:Trulicity refill    Just checking back in again, I will need to pick up the Trulicity by Saturday.     Andrea Huerta      ----- Message -----       From:Haunani Andrea Huerta       Sent:11/16/2022  3:13  PM EDT         PI:[id number] Advice    Subject:Trulicity refill    Just checking back in, I will need to pick up the Trulicity by Saturday.      ----- Message -----       From:Jaszmine Andrea Huerta       Sent:11/16/2022  9:01 AM EDT         YS:[id number] Advice    Subject:Trulicity refill    glucophage/metformin      ----- Message -----  From:Joni D       Sent:11/16/2022  8:51 AM EDT         ZO:[id number] Andrea Huerta    Subject:Trulicity refill    Is this the only diabetic medication you're on currently?         ----- Message -----       From:Sharesa Andrea Huerta       Sent:11/14/2022 12:25 PM EDT         VW:[id number] Andrea Huerta    Subject:Trulicity refill    I am due to pick-up my Trulicity prescription.  I would like to discuss if any other diabetes medicine can help me with weight loss.     Andrea Huerta

## 2022-11-23 NOTE — Telephone Encounter (Signed)
In your experience, recently in our neighborhood, she more likely to be able to obtain Ozempic.      If she is go ahead and write a script at the highest dose for 90 days and I will approve it.    TY TH

## 2022-11-23 NOTE — Telephone Encounter (Signed)
-----   Message from Clayton Lefort sent at 11/21/2022  4:48 PM EDT -----  Regarding: Trulicity refill  Contact: [phone]  I was unable to fill my Trulicity 4.5 prescription in the MOV. Called many pharmacies.  Lucky I had some 3.0 left and I took that dosage in place of my 4.5.  I have 3 more weeks left of the 3.0.     Please advise.    Andrea Huerta

## 2022-11-30 ENCOUNTER — Encounter (INDEPENDENT_AMBULATORY_CARE_PROVIDER_SITE_OTHER): Payer: Self-pay

## 2022-11-30 ENCOUNTER — Other Ambulatory Visit (INDEPENDENT_AMBULATORY_CARE_PROVIDER_SITE_OTHER): Payer: Self-pay | Admitting: Family Medicine

## 2022-11-30 DIAGNOSIS — E119 Type 2 diabetes mellitus without complications: Secondary | ICD-10-CM

## 2022-11-30 MED ORDER — TRULICITY 4.5 MG/0.5 ML SUBCUTANEOUS PEN INJECTOR
PEN_INJECTOR | SUBCUTANEOUS | 2 refills | Status: DC
Start: 2022-11-30 — End: 2022-12-13

## 2022-11-30 NOTE — Telephone Encounter (Signed)
Pharmacy adjustment for supply issue.       Andrea Garibaldi, LPN

## 2022-12-02 ENCOUNTER — Other Ambulatory Visit (INDEPENDENT_AMBULATORY_CARE_PROVIDER_SITE_OTHER): Payer: Self-pay | Admitting: Family Medicine

## 2022-12-02 DIAGNOSIS — F32A Depression, unspecified: Secondary | ICD-10-CM

## 2022-12-02 MED ORDER — BUSPIRONE 5 MG TABLET
5.0000 mg | ORAL_TABLET | Freq: Three times a day (TID) | ORAL | 1 refills | Status: AC | PRN
Start: 2022-12-02 — End: ?

## 2022-12-02 NOTE — Telephone Encounter (Signed)
Last scheduled appointment with you was 06/17/2022.  Currently scheduled future appointment is 12/13/2022.      Egbert Garibaldi, LPN  1/61/0960, 16:15

## 2022-12-13 ENCOUNTER — Encounter (INDEPENDENT_AMBULATORY_CARE_PROVIDER_SITE_OTHER): Payer: Self-pay | Admitting: Family Medicine

## 2022-12-13 ENCOUNTER — Telehealth (HOSPITAL_BASED_OUTPATIENT_CLINIC_OR_DEPARTMENT_OTHER): Payer: Self-pay | Admitting: OTOLARYNGOLOGY

## 2022-12-13 ENCOUNTER — Other Ambulatory Visit: Payer: Self-pay

## 2022-12-13 ENCOUNTER — Ambulatory Visit (INDEPENDENT_AMBULATORY_CARE_PROVIDER_SITE_OTHER): Payer: 59 | Admitting: Family Medicine

## 2022-12-13 VITALS — BP 134/78 | HR 96 | Temp 98.9°F | Resp 16 | Ht 62.0 in | Wt 201.8 lb

## 2022-12-13 DIAGNOSIS — E119 Type 2 diabetes mellitus without complications: Secondary | ICD-10-CM

## 2022-12-13 MED ORDER — ZEPBOUND 2.5 MG/0.5 ML SUBCUTANEOUS PEN INJECTOR
2.5000 mg | PEN_INJECTOR | SUBCUTANEOUS | 2 refills | Status: DC
Start: 2022-12-13 — End: 2023-11-01

## 2022-12-13 NOTE — Nursing Note (Signed)
12/13/22 1420   Depression Screen   Little interest or pleasure in doing things. 1   Feeling down, depressed, or hopeless 1   PHQ 2 Total 2     Camila Li, MA

## 2022-12-13 NOTE — Telephone Encounter (Signed)
Lvm to resch pt

## 2022-12-13 NOTE — Progress Notes (Signed)
FAMILY MEDICINE, [facility]  [address]  [place] [address]       Name: Andrea Huerta MRN:  [id number]   Date: 12/13/2022 Age: 62 y.o.     Chief complaint: The patient is a 62 y.o. old female who came in today for Diabetes (Last HGA1C was 6.8 on 05/25/22. Pt states she sometimes checks her blood sugar at home.) and Weight Loss (Pt wants to discuss weight loss treatment options. )      HPI:     Diabetes: Last HbA1C was 6.8% in December 2023.  She is due for new A1c.  Patient denies symptoms of polydipsia, polyuria, hypoglycemic unawareness, chest pain, dyspnea on exertion, diarrhea, foot ulcerations, paresthesias. Symptoms are not changed. Patient's home blood sugars are controlled. Patient is compliant with medications.  She is currently prescribed metformin 1000 every evening with dinner.  She was also prescribed Trulicity 4.5 mg.  She is had no recent side effects of Trulicity.  She would like to change from Trulicity to a more effective weight loss medication.    Anxiety: Patient complains of fewer symptoms of racing thoughts, feelings of losing control. She states that symptoms have been present for the past several years. Course to date has been well controlled. She denies current suicidal and homicidal ideation. No major difficulty in behaviors is reported. GAD-7 reviewed with patient in room. Patient is not currently involved with psychiatry or counseling.  She is currently prescribed an SSRI (Lexapro) with good results.  She was also prescribed BuSpar.    Ear fullness:  She has been to [place] to see Dr. Rachell Cipro.  She had an in our tee which was reviewed with her and gave her some reassurance.  She was treated with steroids as a nasal spray as well as extended dose of antibiotic therapy.  She feels somewhat better at this point.      ROS:  Review of systems completed with all positives and pertinent negatives as per HPI.  Would be interested in treatment.  Otherwise,  negative.    Past medical history:  Past Medical History:   Diagnosis Date    Diabetes mellitus, type 2 (CMS HCC)     H/O complete eye exam 01/2016    H/O mammogram 2017    High cholesterol     History of dental examination 04/2016    Hypertension     Sleep apnea     Vaginal Pap smear 10/2014     Past surgical history:  Past Surgical History:   Procedure Laterality Date    HX CESAREAN SECTION      HX TONSILLECTOMY      HX WISDOM TEETH EXTRACTION      MOLE REMOVAL      several all normal     Medications:  Current Outpatient Medications   Medication Sig    ASPIRIN (ASPIR-81 ORAL) Once a day    Blood Sugar Diagnostic (FREESTYLE LITE STRIPS) Strip 1 Strip Twice daily    busPIRone (BUSPAR) 5 mg Oral Tablet Take 1 Tablet (5 mg total) by mouth Three times a day as needed (anxiety)    cetirizine (ZYRTEC) 10 mg Oral Tablet, Chewable Chew 1 Tablet (10 mg total) Once a day    escitalopram oxalate (LEXAPRO) 20 mg Oral Tablet TAKE 1 TABLET BY MOUTH EVERY DAY    flash glucose scanning reader (FREESTYLE LIBRE 14 DAY READER) Does not apply Misc For checking blood sugars 4-5 times daily.    flash  glucose sensor (FREESTYLE LIBRE 14 DAY SENSOR) Does not apply Kit For checking blood sugars 4-5 times daily. On 4 insulin injections daily.    fluticasone propionate (FLONASE) 50 mcg/actuation Nasal Spray, Suspension Administer 2 Sprays into each nostril Once a day    GLUCOSAM/GLUC SU/AC-ALP-D-GLUC (GLUCOSAMINE COMPLEX ORAL) Once a day    lisinopriL (PRINIVIL) 20 mg Oral Tablet TAKE 1 TABLET BY MOUTH EVERY DAY    metFORMIN (GLUCOPHAGE) 500 mg Oral Tablet Take 2 Tablets (1,000 mg total) by mouth Every evening with dinner    pantoprazole (PROTONIX) 40 mg Oral Tablet, Delayed Release (E.C.) TAKE 1 TABLET BY MOUTH EVERY DAY    prenatal vitamin-iron-folate Tablet Take 1 Tablet by mouth Once a day    simvastatin (ZOCOR) 40 mg Oral Tablet TAKE 1 TABLET BY MOUTH EVERY DAY    tirzepatide, weight loss, (ZEPBOUND) 2.5 mg/0.5 mL Subcutaneous Pen  Injector Inject 0.5 mL (2.5 mg total) under the skin Every 7 days    vitamin E 100 unit Oral Capsule Take 1 Capsule (100 Units total) by mouth Once a day     Allergies:  No Known Allergies     Family history:  Family Medical History:       Problem Relation (Age of Onset)    Breast Cancer Mother    Diabetes Mother, Maternal Grandfather    High Cholesterol Mother, Brother    Hypertension (High Blood Pressure) Mother, Father, Brother    Liver Cancer Father    Lymphoma Maternal Aunt    Melanoma Maternal Aunt    No Known Problems Sister, Maternal Grandmother, Paternal Grandmother, Paternal Grandfather, Daughter, Son, Maternal Uncle, Paternal Aunt, Paternal Uncle, Other          Social history:  Social History     Socioeconomic History    Marital status: Married   Occupational History    Occupation: math Runner, broadcasting/film/video- Brewing technologist   Tobacco Use    Smoking status: Never    Smokeless tobacco: Never   Vaping Use    Vaping status: Never Used   Substance and Sexual Activity    Alcohol use: Yes     Comment: 1 or 2 drinks per week    Drug use: Never       Vitals:    12/13/22 1415   BP: 134/78   Pulse: 96   Resp: 16   Temp: 37.2 C (98.9 F)   TempSrc: Thermal Scan   SpO2: 96%   Weight: 91.5 kg (201 lb 12.8 oz)   Height: 1.575 m (5\' 2" )   BMI: 36.99         Three  year weight curve.  Has had winter weight gain with recent reduction.    Physical Examination:    GENERAL: Pt is a pleasant, well-nourished, well-developed 62 y.o. female who is in NAD. Appears stated age  HEENT:  head normocephalic, symmetrical facies. EOM intact b/l. PERRLA. Sclera non-icteric, non-injected.  Right auditory canal is clear. No rhinorrhea. Oropharyngeal mucous membranes are pink and moist.    NECK: supple. No masses, lymphadenopathy, JVD on exam. Trachea midline, no thyromegaly   CV: S1, S2. No murmurs, rubs, gallops.     LUNGS: CTAB, No rhonchi, rales, wheezes.   GI: (+) BS in all 4 quadrants. soft, NT/ND. No rigidity/ guarding/ rebound. No organomegaly/masses,  or abdominal bruits  MSK: Spontaneous normal AROM of major joints as observed during exam. No joint effusions/ swelling/ deformities.   No BLE edema, clubbing, or cyanosis.   2+ radial pulse  present b/l.   + 2 reflexes Brachioradialis and patellar bilaterally.   Gait normal.  SKIN: No significant lesions, rashes, ecchymoses noted.  Multiple skin tags appreciated about the neckline bilaterally.    NEURO: AAOx4, CN grossly intact. Sensation intact b/l. No focal deficits.  PSYCH: Mood, behavior and affect normal w/ Intact judgement & insight   Exam not significantly changed.       12/13/22 1413   Recent Weight Change   Have you had a recent unexplained weight loss or gain? N   Domestic Violence   Because we are aware of abuse and domestic violence today, we ask all patients: Are you being hurt, hit, or frightened by anyone at your home or in your life?  N   Basic Needs   Do you have any basic needs within your home that are not being met? (such as Food, Shelter, Civil Service fast streamer, Tranportation, paying for bills and/or medications) N      12/13/22 1420   Depression Screen   Little interest or pleasure in doing things. 1   Feeling down, depressed, or hopeless 1   PHQ 2 Total 2        12/13/22 1420   Gad-7 (Over the last 2 weeks, how often have you been bothered by the following problems?)   Feeling nervous,anxious,on edge 0   Not being able to stop or control worrying 0   Worrying too much about different things 0   Trouble relaxing 0   Being so restless that it is hard to sit still 0   Becoming easily annoyed or irritable 0   Feeling afraid as if something awful might happen 0   How difficult have these problems made it for you to work, take care of things at home, or get along with other people? Not difficult at all   Gad-7 Score   Gad-7 Score Total 0   Interpretation 0-4, normal       Diabetes Monitors  A1C: 6.8  A1C Date: 05/25/2022           Nephropathy Screening: On ACEI or ARB  Lab Results   Component Value Date     CHOLESTEROL 165 12/17/2021    HDLCHOL 48 (L) 12/17/2021    LDLCHOL 95 12/17/2021    TRIG 125 12/17/2021     Retinal Exam Date: 06/27/2020  Last diabetic foot exam: Not Found       Visit Diagnosis    Encounter Diagnoses   Name Primary?    Type 2 diabetes mellitus without complication, without long-term current use of insulin (CMS HCC) Yes     Orders Placed This Encounter    CANCELED: HGA1C (HEMOGLOBIN A1C WITH EST AVG GLUCOSE)    CREATININE WITH EGFR    MICROALBUMIN/CREATININE RATIO, URINE, RANDOM    HGA1C (HEMOGLOBIN A1C WITH EST AVG GLUCOSE)    tirzepatide, weight loss, (ZEPBOUND) 2.5 mg/0.5 mL Subcutaneous Pen Injector     Type 2 diabetes mellitus without complication, without long-term current use of insulin (CMS HCC)  Chronic, improved control  Continues to trend down from 9.7 to  6.5% in December of 2023.  Will repeat today.  Continue improved compliance with diabetic diet.  Continue decrease carbohydrate and sugar intake.   Continue structured exercise/ activity for weight loss.   Check blood sugars only as needed and for reference for her dietary changes.   Continue metformin 500 mg to take b.i.d..  She is maxed out on Trulicity.    will try an alternative  medication.  Z bound.  Continue lisinopril 20 mg daily.   Continue simvastatin 40 mg daily.   Will need foot exam. -she is being referred to Podiatry.  (patient has flat warts or early corns developing as well)  Patient follows with ophthalmologist annually.     Essential hypertension  Chronic, now controlled.  Encouraged lifestyle modifications.   Continue behavioral efforts, esp  Cardiac friendly diet,   < 2 gm salt q day.   Exercise 20 min q day most days of the week.  Continue ACEI - lisinopril 20 mg daily.   Labs ordered.     Anxiety and depression  Chronic, controlled with medication.   No SI/HI thoughts voiced at this time.   Continue Lexapro 20 mg.  Continue BuSpar 5 mg TID PRN for breakthrough anxiety.     Vaginal itching-improved.   Consider  atrophic vaginitis   Will start estrogen supplementation after   Reassuring mammogram.  Her last Pap was 03/31/2020.  She has not had a hysterectomy.    Otitis media right side:  Has returned.  No cerumen appreciated.  Will obtain CT to rule out obstructive sources.  Consider dental sources.    Screening for HIV (human immunodeficiency virus)  Encounter for hepatitis C virus screening test for high risk patient  Labs previously ordered.  Discussed HIV and HCV screening with patient in office.   Ordered with labs.      Screening for breast cancer  Mammogram re-ordered.     Return in about 3 months (around 03/15/2023), or if symptoms worsen or fail to improve, for In Person Visit, chronic disease management.    Oneta Rack, MD  12/13/2022 15:00     Electronically signed by Oneta Rack, MD    This note was partially generated using MModal Fluency Direct system, and there may be some incorrect words, spellings, and punctuation that were not noted in checking the note before saving.

## 2022-12-13 NOTE — Nursing Note (Signed)
12/13/22 1413   Recent Weight Change   Have you had a recent unexplained weight loss or gain? N   Domestic Violence   Because we are aware of abuse and domestic violence today, we ask all patients: Are you being hurt, hit, or frightened by anyone at your home or in your life?  N   Basic Needs   Do you have any basic needs within your home that are not being met? (such as Food, Shelter, Civil Service fast streamer, Tranportation, paying for bills and/or medications) Ignacia Palma, MA

## 2022-12-13 NOTE — Nursing Note (Signed)
12/13/22 1420   Gad-7 (Over the last 2 weeks, how often have you been bothered by the following problems?)   Feeling nervous,anxious,on edge 0   Not being able to stop or control worrying 0   Worrying too much about different things 0   Trouble relaxing 0   Being so restless that it is hard to sit still 0   Becoming easily annoyed or irritable 0   Feeling afraid as if something awful might happen 0   How difficult have these problems made it for you to work, take care of things at home, or get along with other people? Not difficult at all   Gad-7 Score   Gad-7 Score Total 0   Interpretation 0-4, normal     Camila Li, [place]

## 2022-12-28 ENCOUNTER — Ambulatory Visit (HOSPITAL_BASED_OUTPATIENT_CLINIC_OR_DEPARTMENT_OTHER): Payer: Self-pay | Admitting: OTOLARYNGOLOGY

## 2023-01-10 ENCOUNTER — Other Ambulatory Visit: Payer: 59 | Attending: FAMILY MEDICINE

## 2023-01-10 ENCOUNTER — Other Ambulatory Visit: Payer: Self-pay

## 2023-01-10 DIAGNOSIS — E119 Type 2 diabetes mellitus without complications: Secondary | ICD-10-CM | POA: Insufficient documentation

## 2023-01-10 LAB — CREATININE WITH EGFR
CREATININE: 0.55 mg/dL (ref 0.52–1.04)
ESTIMATED GFR: 105 mL/min/{1.73_m2} (ref >=60)

## 2023-01-10 LAB — MICROALBUMIN/CREATININE RATIO, URINE, RANDOM
CREATININE RANDOM URINE: 68 mg/dL — ABNORMAL LOW (ref 100–200)
MICROALBUMIN RANDOM URINE: <0.6 mg/dL (ref 0.0–1.7)

## 2023-01-10 LAB — HGA1C (HEMOGLOBIN A1C WITH EST AVG GLUCOSE)
ESTIMATED AVERAGE GLUCOSE: 140 mg/dL
HEMOGLOBIN A1C: 6.5 % — ABNORMAL HIGH (ref <5.7)

## 2023-01-10 NOTE — Result Encounter Note (Signed)
Please inform the patient her hemoglobin A1c is the lowest it has been in 2 years.  Encouraged her to keep up the good work.      Her urinalysis showed she is not spilling significant levels of protein.  This is very good news.  I am making no change to her plan of care.    TY TH

## 2023-01-10 NOTE — Ancillary Notes (Signed)
Andrea Huerta [place]    Venipuncture performed in office on right arm antecubital vein, dry pressure dressing was applied to site and patient tolerated it well.  Specimen was centrifuged, aliquoted as needed and specimen was labeled and packaged for transport.    Melba Coon  01/10/2023, 14:20

## 2023-02-15 ENCOUNTER — Encounter (INDEPENDENT_AMBULATORY_CARE_PROVIDER_SITE_OTHER): Payer: Self-pay | Admitting: Family Medicine

## 2023-02-21 ENCOUNTER — Encounter (INDEPENDENT_AMBULATORY_CARE_PROVIDER_SITE_OTHER): Payer: Self-pay | Admitting: Family Medicine

## 2023-02-21 DIAGNOSIS — E119 Type 2 diabetes mellitus without complications: Secondary | ICD-10-CM

## 2023-02-23 MED ORDER — DULAGLUTIDE 4.5 MG/0.5 ML SUBCUTANEOUS PEN INJECTOR
4.5000 mg | PEN_INJECTOR | SUBCUTANEOUS | 1 refills | Status: DC
Start: 2023-02-23 — End: 2023-03-02

## 2023-03-02 ENCOUNTER — Telehealth (INDEPENDENT_AMBULATORY_CARE_PROVIDER_SITE_OTHER): Payer: Self-pay | Admitting: Family Medicine

## 2023-03-02 ENCOUNTER — Other Ambulatory Visit (INDEPENDENT_AMBULATORY_CARE_PROVIDER_SITE_OTHER): Payer: Self-pay | Admitting: Family Medicine

## 2023-03-02 DIAGNOSIS — E119 Type 2 diabetes mellitus without complications: Secondary | ICD-10-CM

## 2023-03-02 NOTE — Telephone Encounter (Signed)
Pt states she was advised by the pharmacy [employer] insurance is requesting a prior auth for zepbound.

## 2023-03-02 NOTE — Telephone Encounter (Signed)
Last visit with PCP was 12/13/2022.    Last office visit in the department was 12/13/2022 .  Currently scheduled future appointment in the department is 06/07/2023.    Buckner Malta, LPN, 3/87/5643 , 11:16

## 2023-03-03 ENCOUNTER — Encounter (INDEPENDENT_AMBULATORY_CARE_PROVIDER_SITE_OTHER): Payer: Self-pay | Admitting: Family Medicine

## 2023-03-03 MED ORDER — DULAGLUTIDE 4.5 MG/0.5 ML SUBCUTANEOUS PEN INJECTOR
4.5000 mg | PEN_INJECTOR | SUBCUTANEOUS | 1 refills | Status: DC
Start: 2023-03-03 — End: 2023-07-18

## 2023-03-03 NOTE — Telephone Encounter (Signed)
PA initiated via [email or web address]       Egbert Garibaldi, LPN

## 2023-03-04 NOTE — Telephone Encounter (Signed)
Decision returned.     "drug not covered by plan."       Patient notified.     Egbert Garibaldi, LPN

## 2023-03-15 ENCOUNTER — Ambulatory Visit (HOSPITAL_BASED_OUTPATIENT_CLINIC_OR_DEPARTMENT_OTHER): Payer: Self-pay | Admitting: OTOLARYNGOLOGY

## 2023-04-11 ENCOUNTER — Ambulatory Visit (HOSPITAL_BASED_OUTPATIENT_CLINIC_OR_DEPARTMENT_OTHER): Payer: Self-pay | Admitting: OTOLARYNGOLOGY

## 2023-05-02 ENCOUNTER — Encounter (INDEPENDENT_AMBULATORY_CARE_PROVIDER_SITE_OTHER): Payer: Self-pay | Admitting: Family Medicine

## 2023-05-03 ENCOUNTER — Other Ambulatory Visit (INDEPENDENT_AMBULATORY_CARE_PROVIDER_SITE_OTHER): Payer: Self-pay | Admitting: Family Medicine

## 2023-05-03 DIAGNOSIS — E119 Type 2 diabetes mellitus without complications: Secondary | ICD-10-CM

## 2023-05-03 MED ORDER — METFORMIN 500 MG TABLET
500.0000 mg | ORAL_TABLET | Freq: Three times a day (TID) | ORAL | 3 refills | Status: DC
Start: 2023-05-03 — End: 2023-07-20

## 2023-05-03 NOTE — Telephone Encounter (Signed)
Last scheduled appointment with you was 12/13/2022.  Currently scheduled future appointment is 06/07/2023.      Egbert Garibaldi, LPN  69/62/9528, 16:55

## 2023-05-09 ENCOUNTER — Other Ambulatory Visit (INDEPENDENT_AMBULATORY_CARE_PROVIDER_SITE_OTHER): Payer: Self-pay | Admitting: Family Medicine

## 2023-05-09 DIAGNOSIS — E119 Type 2 diabetes mellitus without complications: Secondary | ICD-10-CM

## 2023-05-18 ENCOUNTER — Other Ambulatory Visit: Payer: 59 | Attending: Family Medicine

## 2023-05-18 ENCOUNTER — Other Ambulatory Visit: Payer: Self-pay

## 2023-05-18 DIAGNOSIS — E119 Type 2 diabetes mellitus without complications: Secondary | ICD-10-CM | POA: Insufficient documentation

## 2023-05-18 LAB — HGA1C (HEMOGLOBIN A1C WITH EST AVG GLUCOSE)
ESTIMATED AVERAGE GLUCOSE: 117 mg/dL
HEMOGLOBIN A1C: 5.7 % — ABNORMAL HIGH (ref <5.7)

## 2023-05-18 NOTE — Ancillary Notes (Signed)
[place]    Venipuncture performed in office on right arm antecubital vein, dry pressure dressing was applied to site and patient tolerated it well.  Specimen was centrifuged, aliquoted as needed and specimen was labeled and packaged for transport.    Dekota Shenk, PHLEBOTOMIST  05/18/2023, 13:42

## 2023-05-18 NOTE — Result Encounter Note (Signed)
Please inform the patient that her A1c is improved now 5.7%.  This places her in the prediabetic range.      Encouraged her to keep up the good work.      TY TH

## 2023-05-26 ENCOUNTER — Encounter (INDEPENDENT_AMBULATORY_CARE_PROVIDER_SITE_OTHER): Payer: Self-pay | Admitting: Family Medicine

## 2023-05-26 ENCOUNTER — Other Ambulatory Visit (INDEPENDENT_AMBULATORY_CARE_PROVIDER_SITE_OTHER): Payer: Self-pay | Admitting: Family Medicine

## 2023-05-26 DIAGNOSIS — Z1231 Encounter for screening mammogram for malignant neoplasm of breast: Secondary | ICD-10-CM

## 2023-06-06 ENCOUNTER — Ambulatory Visit (HOSPITAL_BASED_OUTPATIENT_CLINIC_OR_DEPARTMENT_OTHER): Payer: Self-pay | Admitting: OTOLARYNGOLOGY

## 2023-06-07 ENCOUNTER — Encounter (INDEPENDENT_AMBULATORY_CARE_PROVIDER_SITE_OTHER): Payer: 59 | Admitting: Family Medicine

## 2023-06-09 ENCOUNTER — Other Ambulatory Visit (HOSPITAL_BASED_OUTPATIENT_CLINIC_OR_DEPARTMENT_OTHER): Payer: Self-pay | Admitting: Family Medicine

## 2023-06-09 DIAGNOSIS — K219 Gastro-esophageal reflux disease without esophagitis: Secondary | ICD-10-CM

## 2023-06-09 DIAGNOSIS — I10 Essential (primary) hypertension: Secondary | ICD-10-CM

## 2023-06-09 NOTE — Telephone Encounter (Signed)
Last scheduled appointment with you was 12/13/2022.  Currently scheduled future appointment is 09/07/23      Egbert Garibaldi, LPN  16/03/9603, 08:24

## 2023-07-11 ENCOUNTER — Ambulatory Visit
Admission: RE | Admit: 2023-07-11 | Discharge: 2023-07-11 | Disposition: A | Discharge status: Home / Self Care | Payer: 59 | Source: Ambulatory Visit | Attending: Family Medicine | Admitting: Family Medicine

## 2023-07-11 ENCOUNTER — Other Ambulatory Visit: Payer: Self-pay

## 2023-07-11 DIAGNOSIS — Z1231 Encounter for screening mammogram for malignant neoplasm of breast: Secondary | ICD-10-CM | POA: Insufficient documentation

## 2023-07-11 NOTE — Result Encounter Note (Signed)
Please inform showed no worrisome mass or lesion.  The radiologist recommended repeating this test in 1 year.    We will review this at our follow-up appointment.      TY TH

## 2023-07-18 ENCOUNTER — Other Ambulatory Visit (INDEPENDENT_AMBULATORY_CARE_PROVIDER_SITE_OTHER): Payer: Self-pay | Admitting: Family Medicine

## 2023-07-18 DIAGNOSIS — E119 Type 2 diabetes mellitus without complications: Secondary | ICD-10-CM

## 2023-07-18 NOTE — Telephone Encounter (Signed)
Last scheduled appointment with you was 12/13/22. Currently scheduled future appointment is 09/07/2023.      Egbert Garibaldi, LPN  4/78/2956, 08:27

## 2023-07-19 ENCOUNTER — Other Ambulatory Visit (INDEPENDENT_AMBULATORY_CARE_PROVIDER_SITE_OTHER): Payer: Self-pay | Admitting: Family Medicine

## 2023-07-19 ENCOUNTER — Encounter (INDEPENDENT_AMBULATORY_CARE_PROVIDER_SITE_OTHER): Payer: Self-pay | Admitting: Family Medicine

## 2023-07-19 DIAGNOSIS — E119 Type 2 diabetes mellitus without complications: Secondary | ICD-10-CM

## 2023-07-20 ENCOUNTER — Other Ambulatory Visit (INDEPENDENT_AMBULATORY_CARE_PROVIDER_SITE_OTHER): Payer: Self-pay | Admitting: Family Medicine

## 2023-07-20 DIAGNOSIS — E119 Type 2 diabetes mellitus without complications: Secondary | ICD-10-CM

## 2023-07-20 NOTE — Telephone Encounter (Signed)
Last scheduled appointment with you was 12/13/22. Currently scheduled future appointment is 09/07/2023.      Egbert Garibaldi, LPN  6/96/2952, 12:41

## 2023-07-24 ENCOUNTER — Encounter (HOSPITAL_BASED_OUTPATIENT_CLINIC_OR_DEPARTMENT_OTHER): Payer: Self-pay | Admitting: OTOLARYNGOLOGY

## 2023-08-18 ENCOUNTER — Ambulatory Visit (HOSPITAL_BASED_OUTPATIENT_CLINIC_OR_DEPARTMENT_OTHER): Payer: 59 | Admitting: OTOLARYNGOLOGY

## 2023-08-19 ENCOUNTER — Encounter (INDEPENDENT_AMBULATORY_CARE_PROVIDER_SITE_OTHER): Payer: Self-pay | Admitting: Family Medicine

## 2023-08-22 ENCOUNTER — Other Ambulatory Visit (INDEPENDENT_AMBULATORY_CARE_PROVIDER_SITE_OTHER): Payer: Self-pay | Admitting: Family Medicine

## 2023-08-22 DIAGNOSIS — F419 Anxiety disorder, unspecified: Secondary | ICD-10-CM

## 2023-08-22 MED ORDER — ESCITALOPRAM 20 MG TABLET
20.0000 mg | ORAL_TABLET | Freq: Every day | ORAL | 2 refills | Status: AC
Start: 2023-08-22 — End: ?

## 2023-08-22 NOTE — Telephone Encounter (Signed)
 Last scheduled appointment with you was 12/13/22. Currently scheduled future appointment is 09/07/2023.      Egbert Garibaldi, LPN  06/26/1094, 04:54

## 2023-09-07 ENCOUNTER — Encounter (INDEPENDENT_AMBULATORY_CARE_PROVIDER_SITE_OTHER): Payer: Self-pay | Admitting: Family Medicine

## 2023-09-07 ENCOUNTER — Telehealth (INDEPENDENT_AMBULATORY_CARE_PROVIDER_SITE_OTHER): Payer: Self-pay | Admitting: Family Medicine

## 2023-09-07 ENCOUNTER — Ambulatory Visit: Attending: INTERNAL MEDICINE

## 2023-09-07 ENCOUNTER — Ambulatory Visit (INDEPENDENT_AMBULATORY_CARE_PROVIDER_SITE_OTHER): Payer: 59 | Admitting: Family Medicine

## 2023-09-07 ENCOUNTER — Other Ambulatory Visit: Payer: Self-pay

## 2023-09-07 VITALS — BP 128/88 | HR 82 | Temp 98.9°F | Resp 16 | Ht 62.0 in | Wt 198.4 lb

## 2023-09-07 DIAGNOSIS — E119 Type 2 diabetes mellitus without complications: Secondary | ICD-10-CM

## 2023-09-07 DIAGNOSIS — Z124 Encounter for screening for malignant neoplasm of cervix: Secondary | ICD-10-CM

## 2023-09-07 LAB — HGA1C (HEMOGLOBIN A1C WITH EST AVG GLUCOSE)
ESTIMATED AVERAGE GLUCOSE: 128 mg/dL
HEMOGLOBIN A1C: 6.1 % — ABNORMAL HIGH (ref <5.7)

## 2023-09-07 NOTE — Telephone Encounter (Signed)
 Patient would like your recommendation on whether she should get a measles booster vaccination.    Mervyn Skeeters

## 2023-09-07 NOTE — Ancillary Notes (Signed)
  Andrea Huerta    Venipuncture performed in office on right arm antecubital vein, dry pressure dressing was applied to site and patient tolerated it well.  Specimen was centrifuged, aliquoted as needed and specimen was labeled and packaged for transport.    Tramar Brueckner, PHLEBOTOMIST  09/07/2023, 07:39

## 2023-09-08 ENCOUNTER — Encounter (INDEPENDENT_AMBULATORY_CARE_PROVIDER_SITE_OTHER): Payer: Self-pay | Admitting: Family Medicine

## 2023-09-08 ENCOUNTER — Ambulatory Visit (INDEPENDENT_AMBULATORY_CARE_PROVIDER_SITE_OTHER): Payer: Self-pay | Admitting: Family Medicine

## 2023-09-08 NOTE — Result Encounter Note (Signed)
 Please inform the patient that her hemoglobin A1c has increased to 6.1%.  Let her know this is acceptable.  With the previous reading of 5.7 she could realistically only go up.  This rise is a very small rise and does not worry me.    She is to continue her current efforts.      Will review this together at our follow-up appointment.      TY TH

## 2023-09-08 NOTE — Telephone Encounter (Signed)
 Notified patient via MyChart message.     Raleigh Callas, CMA

## 2023-09-08 NOTE — Telephone Encounter (Signed)
 If she knows she has been immunized in the past she does not need a booster.      If she has never been immunized to MMR, or if she is unsure  Than yes she should get immunized.      TY TH

## 2023-10-03 ENCOUNTER — Encounter (INDEPENDENT_AMBULATORY_CARE_PROVIDER_SITE_OTHER): Payer: Self-pay

## 2023-10-31 ENCOUNTER — Encounter (INDEPENDENT_AMBULATORY_CARE_PROVIDER_SITE_OTHER): Payer: Self-pay | Admitting: Family Medicine

## 2023-11-01 ENCOUNTER — Other Ambulatory Visit (INDEPENDENT_AMBULATORY_CARE_PROVIDER_SITE_OTHER): Payer: Self-pay | Admitting: Family Medicine

## 2023-11-01 DIAGNOSIS — E119 Type 2 diabetes mellitus without complications: Secondary | ICD-10-CM

## 2023-11-01 MED ORDER — MOUNJARO 2.5 MG/0.5 ML SUBCUTANEOUS PEN INJECTOR
2.5000 mg | PEN_INJECTOR | SUBCUTANEOUS | 0 refills | Status: DC
Start: 2023-11-01 — End: 2024-02-01

## 2023-11-01 MED ORDER — ZEPBOUND 2.5 MG/0.5 ML SUBCUTANEOUS PEN INJECTOR
2.5000 mg | PEN_INJECTOR | SUBCUTANEOUS | 2 refills | Status: DC
Start: 2023-11-01 — End: 2023-11-01

## 2023-11-01 NOTE — Addendum Note (Signed)
 Addended by: Lea Primmer on: 11/01/2023 04:20 PM     Modules accepted: Orders

## 2023-11-01 NOTE — Telephone Encounter (Signed)
 Last scheduled appointment with you was 09/07/2023.  Currently scheduled future appointment is 03/13/2024.      Lea Primmer, LPN  8/46/9629, 10:07

## 2023-11-01 NOTE — Telephone Encounter (Signed)
 Insurance denied Zepbound .   Suggested Mounjaro.     Pended if you agree       Lea Primmer, LPN

## 2023-11-03 ENCOUNTER — Telehealth (INDEPENDENT_AMBULATORY_CARE_PROVIDER_SITE_OTHER): Payer: Self-pay | Admitting: Family Medicine

## 2023-11-03 NOTE — Telephone Encounter (Signed)
 Patient is questioning if she is to continue medications together or if any changes are needed.       Patient currently taking   Trulicity  4.5 mg once weekly   Mounjaro 2.5 mg once weekly (recently prescribed)       Is she to continue both medications at current doses?   The Mounjaro was recently prescribed at it's starting dose.     Last A1C completed 09/07/23 -- 6.1% (up from 5.7% Nov 2024)         Recommendations?       Tanique Matney, LPN

## 2023-11-04 ENCOUNTER — Encounter (INDEPENDENT_AMBULATORY_CARE_PROVIDER_SITE_OTHER): Payer: Self-pay | Admitting: Family Medicine

## 2023-11-04 NOTE — Telephone Encounter (Signed)
Patient notified via MyChart of recommendations.       Rochele Pages, LPN

## 2023-11-04 NOTE — Telephone Encounter (Signed)
 She should pick 1 or the other.  Whatever is affordable.      TY TH

## 2023-11-07 ENCOUNTER — Encounter (INDEPENDENT_AMBULATORY_CARE_PROVIDER_SITE_OTHER): Payer: Self-pay | Admitting: Family Medicine

## 2023-11-07 NOTE — Nursing Note (Signed)
 Mounjaro  prior auth   Approved  Coverage Start Date:10/03/2023  Coverage End Date:11/01/2024      Patient and pharmacy notified.     Lillymae Duet, LPN

## 2023-12-05 ENCOUNTER — Other Ambulatory Visit (HOSPITAL_BASED_OUTPATIENT_CLINIC_OR_DEPARTMENT_OTHER): Payer: Self-pay | Admitting: OTOLARYNGOLOGY

## 2023-12-07 NOTE — Telephone Encounter (Signed)
 RX approved and encounter closed

## 2023-12-13 LAB — COLOGUARD® COLON CANCER SCREEN: COLOGUARD RESULT: NEGATIVE

## 2023-12-20 ENCOUNTER — Ambulatory Visit (HOSPITAL_BASED_OUTPATIENT_CLINIC_OR_DEPARTMENT_OTHER): Payer: 59 | Admitting: OTOLARYNGOLOGY

## 2024-01-18 ENCOUNTER — Other Ambulatory Visit (HOSPITAL_BASED_OUTPATIENT_CLINIC_OR_DEPARTMENT_OTHER): Payer: Self-pay | Admitting: Family

## 2024-01-18 DIAGNOSIS — R3129 Other microscopic hematuria: Secondary | ICD-10-CM

## 2024-01-20 ENCOUNTER — Other Ambulatory Visit (INDEPENDENT_AMBULATORY_CARE_PROVIDER_SITE_OTHER): Payer: Self-pay | Admitting: Family Medicine

## 2024-01-20 DIAGNOSIS — E119 Type 2 diabetes mellitus without complications: Secondary | ICD-10-CM

## 2024-01-20 NOTE — Telephone Encounter (Signed)
 Last scheduled appointment with you was 09/07/2023.  Currently scheduled next future appointment with you is 03/13/2024  The next office visit in this department is Visit date not found    [facility], LPN  06/28/7972, 91:99

## 2024-01-25 ENCOUNTER — Other Ambulatory Visit: Payer: Self-pay

## 2024-01-25 ENCOUNTER — Ambulatory Visit: Payer: Self-pay | Attending: Family | Admitting: Family

## 2024-01-25 ENCOUNTER — Ambulatory Visit (HOSPITAL_BASED_OUTPATIENT_CLINIC_OR_DEPARTMENT_OTHER)

## 2024-01-25 ENCOUNTER — Encounter (HOSPITAL_BASED_OUTPATIENT_CLINIC_OR_DEPARTMENT_OTHER): Payer: Self-pay | Admitting: Family

## 2024-01-25 VITALS — Ht 62.0 in | Wt 191.0 lb

## 2024-01-25 DIAGNOSIS — R3129 Other microscopic hematuria: Secondary | ICD-10-CM

## 2024-01-25 DIAGNOSIS — Z01818 Encounter for other preprocedural examination: Secondary | ICD-10-CM

## 2024-01-25 DIAGNOSIS — R310 Gross hematuria: Secondary | ICD-10-CM | POA: Insufficient documentation

## 2024-01-25 LAB — URINALYSIS, MICROSCOPIC: RBCS: 3 /HPF (ref 0–5)

## 2024-01-25 LAB — URINALYSIS, MACROSCOPIC
BILIRUBIN: NOT DETECTED mg/dL
GLUCOSE: NOT DETECTED mg/dL
KETONES: NOT DETECTED mg/dL
LEUKOCYTES: NOT DETECTED WBCs/uL
NITRITE: NOT DETECTED
PH: 6 (ref 5.0–?)
PROTEIN: NOT DETECTED mg/dL
SPECIFIC GRAVITY: 1.001 — ABNORMAL LOW (ref 1.005–?)
UROBILINOGEN: 0.2 mg/dL

## 2024-01-25 NOTE — H&P (Signed)
 Urology, Medical Office [facility]  [address]  [place] [address]  [phone]    Date: 01/25/2024  Name: Andrea Huerta  Age: 63 y.o.      Chief of Complaint:   Chief Complaint   Patient presents with    New Patient       Andrea Huerta is a 63 y.o. female  Who presents to the practice today for a new visit [facility]    Referral for intermittent gross hematuria which seemed to occur with heavy lifting.  History of microscopic hematuria.  Denies history kidney stones.  No significant flank pain.  Denies family history of bladder kidney cancer.  No recent films.  Urinalysis today reflects microscopic hematuria.  History of vaginal atrophy.  Patient was started on Estrace  cream per gyn however they discontinued this after patient developed gross hematuria.      Current health problems  Patient Active Problem List    Diagnosis Date Noted    OSA on CPAP 07/16/2021    Type 2 diabetes mellitus without complication, without long-term current use of insulin 03/16/2019    Essential hypertension 03/16/2019    Morbid obesity with BMI of 40.0-44.9, adult (CMS HCC) 03/16/2019    Hyperlipidemia 03/16/2019    Anxiety and depression 03/16/2019       ALLERGIES:  Allergies[1]    Current Outpatient Medications   Medication Sig    ASPIRIN (ASPIR-81 ORAL) Once a day    Blood Sugar Diagnostic (FREESTYLE LITE STRIPS) Strip 1 Strip Twice daily    busPIRone  (BUSPAR ) 5 mg Oral Tablet Take 1 Tablet (5 mg total) by mouth Three times a day as needed (anxiety)    cetirizine  (ZYRTEC ) 10 mg Oral Tablet, Chewable Chew 1 Tablet (10 mg total) Once a day    cranberry fruit (CRANBERRY) 450 mg Oral Tablet Take 2 Tablets (900 mg total) by mouth Three times daily with meals    escitalopram  oxalate (LEXAPRO ) 20 mg Oral Tablet Take 1 Tablet (20 mg total) by mouth Once a day    estradioL  (ESTRACE ) 0.01 % (0.1 mg/gram) Vaginal Cream 1 APPLIC 3 TIMES PER WEEK    flash glucose scanning reader (FREESTYLE LIBRE 14 DAY READER) Does not apply  Misc For checking blood sugars 4-5 times daily.    flash glucose sensor (FREESTYLE LIBRE 14 DAY SENSOR) Does not apply Kit For checking blood sugars 4-5 times daily. On 4 insulin injections daily.    fluticasone  propionate (FLONASE ) 50 mcg/actuation Nasal Spray, Suspension ADMINISTER 2 SPRAYS INTO EACH NOSTRIL ONCE A DAY.    GLUCOSAM/GLUC SU/AC-ALP-D-GLUC (GLUCOSAMINE COMPLEX ORAL) Once a day    lisinopriL  (PRINIVIL ) 20 mg Oral Tablet TAKE 1 TABLET BY MOUTH EVERY DAY    magnesium chloride (SLOW-MAG) 64 mg Oral Tablet, Delayed Release (E.C.) Take 1 Tablet (64 mg total) by mouth Daily    metFORMIN  (GLUCOPHAGE ) 500 mg Oral Tablet TAKE 1 TABLET (500 MG TOTAL) BY MOUTH THREE TIMES DAILY WITH MEALS    pantoprazole  (PROTONIX ) 40 mg Oral Tablet, Delayed Release (E.C.) TAKE 1 TABLET BY MOUTH EVERY DAY    prenatal vitamin-iron-folate Tablet Take 1 Tablet by mouth Daily    simvastatin  (ZOCOR ) 40 mg Oral Tablet TAKE 1 TABLET BY MOUTH EVERY DAY    tirzepatide  (MOUNJARO ) 2.5 mg/0.5 mL Subcutaneous Pen Injector Inject 0.5 mL (2.5 mg total) under the skin Every 7 days    TRULICITY  4.5 mg/0.5 mL Subcutaneous Pen Injector INJECT 0.5 ML (4.5 MG) UNDER THE SKIN EVERY 7  DAYS    vitamin E 100 unit Oral Capsule Take 1 Capsule (100 Units total) by mouth Daily         Last lipid profile and glucose:  Lab Results   Component Value Date    CHOLESTEROL 165 12/17/2021    HDLCHOL 48 (L) 12/17/2021    LDLCHOL 95 12/17/2021    TRIG 125 12/17/2021        Past Medical History:   Diagnosis Date    Diabetes mellitus, type 2     H/O complete eye exam 01/2016    H/O mammogram 2017    High cholesterol     History of dental examination 04/2016    Hypertension     Sleep apnea     Vaginal Pap smear 10/2014         Past Surgical History:   Procedure Laterality Date    HX CESAREAN SECTION      HX TONSILLECTOMY      HX WISDOM TEETH EXTRACTION      MOLE REMOVAL      several all normal         ASPIRIN (ASPIR-81 ORAL), Once a day  Blood Sugar Diagnostic  (FREESTYLE LITE STRIPS) Strip, 1 Strip Twice daily  busPIRone  (BUSPAR ) 5 mg Oral Tablet, Take 1 Tablet (5 mg total) by mouth Three times a day as needed (anxiety)  cetirizine  (ZYRTEC ) 10 mg Oral Tablet, Chewable, Chew 1 Tablet (10 mg total) Once a day  cranberry fruit (CRANBERRY) 450 mg Oral Tablet, Take 2 Tablets (900 mg total) by mouth Three times daily with meals  escitalopram  oxalate (LEXAPRO ) 20 mg Oral Tablet, Take 1 Tablet (20 mg total) by mouth Once a day  estradioL  (ESTRACE ) 0.01 % (0.1 mg/gram) Vaginal Cream, 1 APPLIC 3 TIMES PER WEEK  flash glucose scanning reader (FREESTYLE LIBRE 14 DAY READER) Does not apply Misc, For checking blood sugars 4-5 times daily.  flash glucose sensor (FREESTYLE LIBRE 14 DAY SENSOR) Does not apply Kit, For checking blood sugars 4-5 times daily. On 4 insulin injections daily.  fluticasone  propionate (FLONASE ) 50 mcg/actuation Nasal Spray, Suspension, ADMINISTER 2 SPRAYS INTO EACH NOSTRIL ONCE A DAY.  GLUCOSAM/GLUC SU/AC-ALP-D-GLUC (GLUCOSAMINE COMPLEX ORAL), Once a day  lisinopriL  (PRINIVIL ) 20 mg Oral Tablet, TAKE 1 TABLET BY MOUTH EVERY DAY  magnesium chloride (SLOW-MAG) 64 mg Oral Tablet, Delayed Release (E.C.), Take 1 Tablet (64 mg total) by mouth Daily  metFORMIN  (GLUCOPHAGE ) 500 mg Oral Tablet, TAKE 1 TABLET (500 MG TOTAL) BY MOUTH THREE TIMES DAILY WITH MEALS  pantoprazole  (PROTONIX ) 40 mg Oral Tablet, Delayed Release (E.C.), TAKE 1 TABLET BY MOUTH EVERY DAY  prenatal vitamin-iron-folate Tablet, Take 1 Tablet by mouth Daily  simvastatin  (ZOCOR ) 40 mg Oral Tablet, TAKE 1 TABLET BY MOUTH EVERY DAY  tirzepatide  (MOUNJARO ) 2.5 mg/0.5 mL Subcutaneous Pen Injector, Inject 0.5 mL (2.5 mg total) under the skin Every 7 days  TRULICITY  4.5 mg/0.5 mL Subcutaneous Pen Injector, INJECT 0.5 ML (4.5 MG) UNDER THE SKIN EVERY 7 DAYS  vitamin E 100 unit Oral Capsule, Take 1 Capsule (100 Units total) by mouth Daily    No facility-administered medications prior to visit.     Family Medical  History:       Problem Relation (Age of Onset)    Breast Cancer Mother    Diabetes Mother, Maternal Grandfather    High Cholesterol Mother, Brother    Hypertension (High Blood Pressure) Mother, Father, Brother    Liver Cancer Father    Lymphoma Maternal  Aunt    Melanoma Maternal Aunt    No Known Problems Sister, Maternal Grandmother, Paternal Grandmother, Paternal Grandfather, Daughter, Son, Maternal Uncle, Paternal Aunt, Paternal Uncle, Other            Social History     Socioeconomic History    Marital status: Married     Spouse name: Not on file    Number of children: Not on file    Years of education: Not on file    Highest education level: Not on file   Occupational History    Occupation: math teacher- Brewing technologist   Tobacco Use    Smoking status: Never    Smokeless tobacco: Never   Vaping Use    Vaping status: Never Used   Substance and Sexual Activity    Alcohol use: Yes     Comment: 1 or 2 drinks per week    Drug use: Never    Sexual activity: Not on file   Other Topics Concern    Not on file   Social History Narrative    Not on file     Social Determinants of Health     Financial Resource Strain: Not on file   Transportation Needs: Not on file   Social Connections: Not on file   Intimate Partner Violence: Not on file   Housing Stability: Not on file      Immunization History   Administered Date(s) Administered    Covid-19 Vaccine,Moderna(Spikevax),59mcg/0.5mL,12 yr+,Seasonal 05/23/2023    Covid-19 Vaccine,Pfizer-BioNTech,Purple Top,29yrs+ 06/28/2019, 07/21/2019, 03/20/2020    FLUZONE HD VACCINE (ADMIN) 07/27/2016, 06/30/2017    HEPATITIS A VACCINE -ADULT 07/29/2017, 04/25/2018    Influenza Vaccine, 6 month-adult 04/25/2002, 05/06/2004, 05/06/2005, 04/17/2007, 04/11/2008, 02/03/2010, 02/16/2012, 03/30/2016, 04/18/2017, 05/27/2018, 03/31/2020    PPD(ADMIN) 12/14/2011    Pneumovax 06/12/2012, 11/15/2017    Shingrix - Zoster Vaccine 07/14/2017, 11/15/2017, 05/28/2019, 12/27/2019    Tetanus Toxoid/Diphtheria  Toxoid/Acellular Pertussis Vaccine, Adsorbed 03/12/2020    VAXNEUVANCE  12/17/2021    ZOSTAVAX (VARICELLA ZOSTER VACCINE) 12/27/2019       ROS:   GENERAL: NOT PRESENT- CHILLS, FEVER  RESPIRATORY: NOT PRESENT- DIFFICULTY BREATHING OR DYSPNEA  CARDIOVASCULAR: NOT PRESENT- CHEST PAIN  GI: NOT PRESENT: ABDOMINAL PAIN, NAUSEA, VOMITING  GU: SEE HPI  NEUROLOGICAL: NOT PRESENT- SEIZURES AND STROKE  ENDOCRINE: PRESENT- DIABETES  HEMATOLOGY: NOT PRESENT- ABNORMAL BLEEDING AND BLOOD CLOTS    OBJECTIVE:   Ht 1.575 m (5' 2)   Wt 86.6 kg (191 lb)   LMP  (LMP Unknown)   BMI 34.93 kg/m        Body mass index is 34.93 kg/m.   Exam:   GENERAL: ALERT, COOPERATIVE, NOT IN ACUTE DISTRESS  HEENT: EXAM UNREMARKABLE  NEUROLOGIC: AFFECT- NORMAL, SPEECH- NORMAL, GAIT- NORMAL  NEUROPSYCHIATRIC: ORIENTED X3, PATIENTS MOOD AND AFFECT IS NORMAL    Assessment     ASSESSMENT & PLAN:     ICD-10-CM    1. Preop testing  Z01.818 ECG 12 LEAD      2. Gross hematuria  R31.0 CT IVP/UROGRAM (ABDOMEN/PELVIS WO/W IV CONTRAST)        Orders Placed This Encounter    CT IVP/UROGRAM (ABDOMEN/PELVIS WO/W IV CONTRAST)    ECG 12 LEAD     Schedule cysto bilateral retrograde pyelogram with pelvic exam under anesthesia.  Needs EKG.  Rocephin 1 gram preop.  Hold Mounjaro  1 week prior  Treatment options, expected benefits and risks were explained to the patient including, but not limited to bleeding, infection, scar tissue formation, pain,  possible need for additional therapy, anesthetic complications, heart problems, pulmonary problems, and death. Questions were addressed.   No follow-ups on file.         [name], APRN  01/25/2024, 14:48          [1] No Known Allergies

## 2024-01-25 NOTE — Nursing Note (Signed)
 Cysto bilateral retrograde pyelogram with pelvic exam under anesthesia scheduled 02/23/24 @ [facility].  Preop instructions reviewed with patient and I encouraged her to have her preop testing completed today before leaving our office.  Patient v/u and had no further questions.    Olivia Kirks, CMA

## 2024-01-26 ENCOUNTER — Ambulatory Visit (HOSPITAL_BASED_OUTPATIENT_CLINIC_OR_DEPARTMENT_OTHER): Payer: Self-pay | Admitting: Family

## 2024-01-26 DIAGNOSIS — N201 Calculus of ureter: Secondary | ICD-10-CM

## 2024-01-26 DIAGNOSIS — Z01818 Encounter for other preprocedural examination: Secondary | ICD-10-CM

## 2024-01-26 DIAGNOSIS — R9431 Abnormal electrocardiogram [ECG] [EKG]: Secondary | ICD-10-CM

## 2024-01-26 LAB — ECG 12 LEAD
Atrial Rate: 104 {beats}/min
Calculated P Axis: 34 degrees
Calculated R Axis: -12 degrees
Calculated T Axis: 10 degrees
PR Interval: 196 ms
QRS Duration: 80 ms
QT Interval: 338 ms
QTC Calculation: 444 ms
Ventricular rate: 104 {beats}/min

## 2024-01-26 NOTE — Telephone Encounter (Signed)
 Her EKG was abnormal indicating potential previous MI. Please refer to cardiology for further evaluation and clearance.

## 2024-01-26 NOTE — Telephone Encounter (Signed)
-----   Message from [place], [place] sent at 01/26/2024 10:02 AM EDT -----  Patient calling because she saw her EKG results on her MyChart and she wants to speak with someone about the results because they look abnormal    [place], [place]  01/26/2024 10:01

## 2024-01-26 NOTE — Telephone Encounter (Signed)
 I spoke with patient regarding her abnormal preop EKG.  She is in agreement to be referred to [facility] for cardiac clearance.    Olivia Kirks, CMA

## 2024-01-26 NOTE — Addendum Note (Signed)
 Addended by: GUSTAV OLIVIA SHAKER on: 01/26/2024 02:28 PM     Modules accepted: Orders

## 2024-01-26 NOTE — Telephone Encounter (Signed)
 Please advise.    Synetta Fail, CMA

## 2024-01-26 NOTE — Telephone Encounter (Signed)
 Referral sent to [facility].    Olivia Kirks, CMA

## 2024-01-30 ENCOUNTER — Telehealth (HOSPITAL_BASED_OUTPATIENT_CLINIC_OR_DEPARTMENT_OTHER): Payer: Self-pay | Admitting: Family

## 2024-01-30 ENCOUNTER — Encounter (HOSPITAL_BASED_OUTPATIENT_CLINIC_OR_DEPARTMENT_OTHER): Payer: Self-pay

## 2024-01-30 ENCOUNTER — Other Ambulatory Visit: Payer: Self-pay

## 2024-01-30 ENCOUNTER — Encounter (HOSPITAL_BASED_OUTPATIENT_CLINIC_OR_DEPARTMENT_OTHER): Payer: Self-pay | Admitting: Family

## 2024-01-30 NOTE — Telephone Encounter (Signed)
 No urinary tract infection.  It is common see trace of bacteria with urinalysis sample.  No significant white blood cell seen either to indicate urinary tract infection.

## 2024-01-30 NOTE — Nursing Note (Signed)
 Patient notified that there was no evidence of UTI and that bacteria is not uncommon in the urinalysis.  She v/u and had no further questions.    Olivia Kirks, CMA

## 2024-01-30 NOTE — Telephone Encounter (Signed)
 Please advise.    Wyline Copas, LPN

## 2024-01-31 ENCOUNTER — Ambulatory Visit
Admission: RE | Admit: 2024-01-31 | Discharge: 2024-01-31 | Disposition: A | Discharge status: Home / Self Care | Source: Ambulatory Visit | Attending: Family | Admitting: Family

## 2024-01-31 DIAGNOSIS — R310 Gross hematuria: Secondary | ICD-10-CM | POA: Insufficient documentation

## 2024-01-31 DIAGNOSIS — N200 Calculus of kidney: Secondary | ICD-10-CM

## 2024-01-31 DIAGNOSIS — K449 Diaphragmatic hernia without obstruction or gangrene: Secondary | ICD-10-CM

## 2024-01-31 DIAGNOSIS — K429 Umbilical hernia without obstruction or gangrene: Secondary | ICD-10-CM

## 2024-01-31 MED ORDER — IOPAMIDOL 370 MG IODINE/ML (76 %) INTRAVENOUS SOLUTION
80.0000 mL | INTRAVENOUS | Status: AC
Start: 2024-01-31 — End: 2024-01-31
  Administered 2024-01-31: 80 mL via INTRAVENOUS

## 2024-01-31 NOTE — Telephone Encounter (Signed)
 Please inform CT results today personally reviewed.  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  I would recommend checking KUB so we can give patient further procedure options for this stone.  Order entered.

## 2024-02-01 ENCOUNTER — Ambulatory Visit: Payer: Self-pay | Attending: Cardiovascular Disease | Admitting: Cardiovascular Disease

## 2024-02-01 ENCOUNTER — Ambulatory Visit (HOSPITAL_BASED_OUTPATIENT_CLINIC_OR_DEPARTMENT_OTHER)
Admission: RE | Admit: 2024-02-01 | Discharge: 2024-02-01 | Disposition: A | Discharge status: Home / Self Care | Source: Ambulatory Visit | Attending: Internal Medicine

## 2024-02-01 ENCOUNTER — Encounter (HOSPITAL_BASED_OUTPATIENT_CLINIC_OR_DEPARTMENT_OTHER): Payer: Self-pay | Admitting: Cardiovascular Disease

## 2024-02-01 ENCOUNTER — Telehealth (HOSPITAL_BASED_OUTPATIENT_CLINIC_OR_DEPARTMENT_OTHER): Payer: Self-pay | Admitting: Cardiovascular Disease

## 2024-02-01 ENCOUNTER — Other Ambulatory Visit: Payer: Self-pay

## 2024-02-01 VITALS — BP 132/84 | HR 85 | Ht 62.0 in | Wt 193.0 lb

## 2024-02-01 DIAGNOSIS — G4733 Obstructive sleep apnea (adult) (pediatric): Secondary | ICD-10-CM

## 2024-02-01 DIAGNOSIS — R9431 Abnormal electrocardiogram [ECG] [EKG]: Secondary | ICD-10-CM

## 2024-02-01 DIAGNOSIS — E119 Type 2 diabetes mellitus without complications: Secondary | ICD-10-CM

## 2024-02-01 DIAGNOSIS — I5030 Unspecified diastolic (congestive) heart failure: Secondary | ICD-10-CM

## 2024-02-01 DIAGNOSIS — E782 Mixed hyperlipidemia: Secondary | ICD-10-CM

## 2024-02-01 DIAGNOSIS — Z7984 Long term (current) use of oral hypoglycemic drugs: Secondary | ICD-10-CM

## 2024-02-01 DIAGNOSIS — I361 Nonrheumatic tricuspid (valve) insufficiency: Secondary | ICD-10-CM

## 2024-02-01 LAB — ECG 12 LEAD W/ INTERP (AMB USE ONLY) (MUSE, IN CLINC) (93005/93010)
Atrial Rate: 87 {beats}/min
Calculated P Axis: 53 degrees
Calculated R Axis: 0 degrees
Calculated T Axis: 18 degrees
PR Interval: 180 ms
QRS Duration: 80 ms
QT Interval: 362 ms
QTC Calculation: 435 ms
Ventricular rate: 87 {beats}/min

## 2024-02-01 LAB — TRANSTHORACIC ECHOCARDIOGRAM - ADULT: EF VISUAL ESTIMATE: 60

## 2024-02-01 MED ORDER — ROSUVASTATIN 20 MG TABLET
20.0000 mg | ORAL_TABLET | Freq: Every evening | ORAL | 4 refills | Status: AC
Start: 2024-02-01 — End: ?

## 2024-02-01 NOTE — Telephone Encounter (Signed)
-----   Message from [name], APRN sent at 01/31/2024 11:53 AM EDT -----      ----- Message -----  From: [name]  Sent: 01/31/2024  11:32 AM EDT  To: [name], APRN

## 2024-02-01 NOTE — Telephone Encounter (Signed)
 Called pt, reviewed echo results and EKG.  Will repeat study in 1 yr.  Pt to continue with current treatment plan and f/up visits. Pt v/u and agreed. Londa Arts, RN

## 2024-02-01 NOTE — Progress Notes (Unsigned)
 CARDIOLOGY, MEDICAL OFFICE BUILDING D  [address]  [place] [address]  [phone]  New Patient Visit Note    Date:   02/01/2024  Name: Andrea Huerta  Age: 63 y.o.  PCP: Corean Hummer, NP  [address]  [place] [address]      Chief Complaint: New Patient    History of Present Illness  Andrea Huerta is a 63 y.o. female who is brought in today for evaluation for preoperative eval of hematuria.  The patient had an electrocardiogram performed which revealed the possibility of an old IMI in the electronic read of an anterior wall myocardial infarction.  The patient has no prior history of coronary artery disease.  She does have a history of type 2 diabetes and hyperlipidemia.  She is active without chest pain or shortness of breath.  She is a nonsmoker..      Current Outpatient Medications   Medication Sig    ASPIRIN (ASPIR-81 ORAL) Once a day (Patient not taking: Reported on 02/01/2024)    Blood Sugar Diagnostic (FREESTYLE LITE STRIPS) Strip 1 Strip Twice daily    busPIRone  (BUSPAR ) 5 mg Oral Tablet Take 1 Tablet (5 mg total) by mouth Three times a day as needed (anxiety)    cranberry fruit (CRANBERRY) 450 mg Oral Tablet Take 2 Tablets (900 mg total) by mouth Three times daily with meals    escitalopram  oxalate (LEXAPRO ) 20 mg Oral Tablet Take 1 Tablet (20 mg total) by mouth Once a day    flash glucose scanning reader (FREESTYLE LIBRE 14 DAY READER) Does not apply Misc For checking blood sugars 4-5 times daily.    flash glucose sensor (FREESTYLE LIBRE 14 DAY SENSOR) Does not apply Kit For checking blood sugars 4-5 times daily. On 4 insulin injections daily.    fluticasone  propionate (FLONASE ) 50 mcg/actuation Nasal Spray, Suspension ADMINISTER 2 SPRAYS INTO EACH NOSTRIL ONCE A DAY.    GLUCOSAM/GLUC SU/AC-ALP-D-GLUC (GLUCOSAMINE COMPLEX ORAL) Once a day (Patient not taking: Reported on 02/01/2024)    lisinopriL  (PRINIVIL ) 20 mg Oral Tablet TAKE 1 TABLET BY MOUTH EVERY DAY    metFORMIN   (GLUCOPHAGE ) 500 mg Oral Tablet TAKE 1 TABLET (500 MG TOTAL) BY MOUTH THREE TIMES DAILY WITH MEALS    pantoprazole  (PROTONIX ) 40 mg Oral Tablet, Delayed Release (E.C.) TAKE 1 TABLET BY MOUTH EVERY DAY    rosuvastatin  (CRESTOR ) 20 mg Oral Tablet Take 1 Tablet (20 mg total) by mouth Every evening    vitamin E 100 unit Oral Capsule Take 1 Capsule (100 Units total) by mouth Daily     Allergies[1]  Past Medical History:   Diagnosis Date    Diabetes mellitus, type 2     H/O complete eye exam 01/2016    H/O mammogram 2017    High cholesterol     History of dental examination 04/2016    Hypertension     Sleep apnea     Vaginal Pap smear 10/2014         Past Surgical History:   Procedure Laterality Date    HX CESAREAN SECTION      HX TONSILLECTOMY      HX WISDOM TEETH EXTRACTION      MOLE REMOVAL      several all normal         Family Medical History:       Problem Relation (Age of Onset)    Breast Cancer Mother    Diabetes Mother, Maternal Grandfather  High Cholesterol Mother, Brother    Hypertension (High Blood Pressure) Mother, Father, Brother    Liver Cancer Father    Lymphoma Maternal Aunt    Melanoma Maternal Aunt    No Known Problems Sister, Maternal Grandmother, Paternal Grandmother, Paternal Grandfather, Daughter, Son, Maternal Uncle, Paternal Aunt, Paternal Uncle, Other            Social History     Socioeconomic History    Marital status: Married   Occupational History    Occupation: math Runner, broadcasting/film/video- Brewing technologist   Tobacco Use    Smoking status: Never    Smokeless tobacco: Never   Vaping Use    Vaping status: Never Used   Substance and Sexual Activity    Alcohol use: Yes     Comment: 1 or 2 drinks per week    Drug use: Never       Review of Systems  Constitutional: Denies fevers, chills, malaise or abnormal weight loss.    HENT: Denies tinnitus, vertigo, hearing loss, ear pain, sinus drainage, nasal congestion or throat pain.  Eyes: Denies vision changes, diplopia, drainage, pain  Cardiovascular: Denies chest pain,  palpitations, lower extremity edema  Respiratory: Denies cough, shortness of breath, wheezing  Gastrointestinal: Denies abdominal pain, nausea, vomiting, diarrhea, constipation, melana or hematochezia   Endocrine: Denies polyuria, polydipsia, heat or cold intolerance.  Genitourinary: Denies dysuria, urgency, frequency, hematuria, nocturia.  Musculoskeletal: Denies myalgias, arthralgias.  Skin: Denies rashes, suspicious lesions.  Neurological: Denies headaches, weakness, paresthesias  Hematological:  Denies bleeding, spontaneous bruising.  Psychiatric/Behavioral: Denies confusion, agitation, hallucinations, paranoia, delusions        Physical Exam  BP 132/84   Pulse 85   Ht 1.575 m (5' 2)   Wt 87.5 kg (193 lb)   LMP  (LMP Unknown)   SpO2 98%   BMI 35.30 kg/m       GEN: alert and oriented x3, mood and affect normal, judgement intact  HEENT: at/nc, mucous membranes are moist and hearing is intact  Neck: supple without mass, no thyromegaly  Lungs:  No use of accessory muscles or retractions, Clear to auscultation and percussion  CV: neck veins flat, carotid upstrokes brisk, no lifts or thrills, pmi non-displaced, regular rate and rhythm, normal S1/S2, no murmur or gallops  ABD: soft, non-tender, positive bowel sounds, no masses nor bruits, no hepatosplenomegaly  EXT: no clubbing, cyanosis, or edema, distal pulses intact  NEURO:  Cranial nerves 2-12 were grossly intact, motor and sensation is grossly intact  SKIN: warm and dry, no rashes or lesions    Labs:     Lab Results   Component Value Date    HA1C 6.1 (H) 09/07/2023      Lab Results   Component Value Date    LDLCHOL 95 12/17/2021    TRIG 125 12/17/2021    HDLCHOL 48 (L) 12/17/2021    CHOLESTEROL 165 12/17/2021      Lab Results   Component Value Date    SODIUM 140 12/17/2021    POTASSIUM 4.0 12/17/2021    CHLORIDE 105 12/17/2021    CO2 27 12/17/2021    BUN 14 12/17/2021    CREATININE 0.55 01/10/2023    GFR 105 01/10/2023    CALCIUM 9.5 12/17/2021     Lab  Results   Component Value Date    WBC 3.8 12/17/2021    HGB 13.1 12/17/2021    HCT 39.8 12/17/2021       Lab Results   Component Value Date  HA1C 6.1 (H) 09/07/2023       I have reviewed the above labs.       Imaging Studies:   No orders to display     I have read and independently reviewed all EKGs completed in the last 24 hours.      Imaging and other cardiac studies:         LEFT HEART CATH    No results found for this or any previous visit.    STRESS TEST/MYOCARDIAL PERFUSION IMAGING    No results found for this or any previous visit.  Assessment and Plan:     ICD-10-CM    1. Abnormal EKG  R94.31 EKG (today in clinic)     TRANSTHORACIC ECHOCARDIOGRAM - ADULT COMPLETE      2. 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 OSA (obstructive sleep apnea)  G47.33       4. Type 2 diabetes mellitus  E11.9           Orders Placed This Encounter    EKG (today in clinic)    TRANSTHORACIC ECHOCARDIOGRAM - ADULT COMPLETE    rosuvastatin  (CRESTOR ) 20 mg Oral Tablet       63 year old female here for evaluation of an abnormal electrocardiogram prior to undergoing cystoscopy for workup of hematuria.  Repeat electrocardiogram today shows normal sinus rhythm within normal limits.  There was no evidence of an old inferior myocardial infarction or anterior wall myocardial infarction.  Echocardiogram performed today also shows normal wall motion abnormality with no evidence of myocardial infarction.  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  Otherwise pulmonary pressures were within normal limits.  Mixed hyperlipidemia patient is LDLs are not at goal on the Zocor  that she was taken for that reason I have switched Crestor  20 mg she should have follow-up lipid profile in 6-8 weeks.  Diabetes type 2 hemoglobin A1c is below 7 on the current therapy.  She is working on healthy lifestyle to also manage her diabetes mellitus  Obstructive sleep apnea managed by PCP  The  patient was given the opportunity to ask questions and those questions were answered to the patient's satisfaction. The patient was encouraged to call with any additional questions or concerns.     Discussed with patient effects and side effects of medications. Medication safety was discussed. A copy of the patient's medication list was printed and given to the patient.       Andrea Grieves, MD    This note may have been partially generated using MModal Fluency Direct system, and there may be some incorrect words, spellings, and punctuation that were not noted in checking the note before saving.       [1] No Known Allergies

## 2024-02-01 NOTE — Telephone Encounter (Signed)
 Patient advised of CT results and recommendations.  She v/u and will have her KUB performed as soon as possible.    Olivia Kirks, CMA

## 2024-02-05 ENCOUNTER — Encounter (HOSPITAL_BASED_OUTPATIENT_CLINIC_OR_DEPARTMENT_OTHER): Payer: Self-pay | Admitting: Cardiovascular Disease

## 2024-02-06 ENCOUNTER — Other Ambulatory Visit: Payer: Self-pay

## 2024-02-06 ENCOUNTER — Ambulatory Visit
Admission: RE | Admit: 2024-02-06 | Discharge: 2024-02-06 | Disposition: A | Discharge status: Home / Self Care | Source: Ambulatory Visit | Attending: Family | Admitting: Family

## 2024-02-06 ENCOUNTER — Ambulatory Visit (HOSPITAL_BASED_OUTPATIENT_CLINIC_OR_DEPARTMENT_OTHER): Payer: Self-pay | Admitting: Family

## 2024-02-06 DIAGNOSIS — N201 Calculus of ureter: Secondary | ICD-10-CM | POA: Insufficient documentation

## 2024-02-06 NOTE — Telephone Encounter (Signed)
FYI    Loribeth Katich, LPN

## 2024-02-06 NOTE — Telephone Encounter (Signed)
-----   Message from Lauraine BIRCH sent at 02/06/2024  9:52 AM EDT -----  Pt was downstairs to have an US  done.  She called because they did not have an order and she proceeded to say I am wondering if I should just go to another Urology office  I questioned her statement as to why she felt that way.  She said well it's been confusing from the start, I was told I need an US  and I am here now and they are telling me I don't have an order, I was told I had a heart attack and I went to see Dr. Ezekiel and he said I did not have a   Heart attack.  I questioned who told her she had a heart attack and she replied your office did.  She then said lets just figure out what I need today because I am standing downstairs   I called and spoke with Geni, pt was informed she had an abnormal EKG and today she needs a KUB and the order was placed.  Pt notified she needed a KUB xray today and order had already been placed.      Lauraine LITTIE Passy

## 2024-02-06 NOTE — Telephone Encounter (Signed)
 Patient was recently seen per cardiology and cleared for upcoming procedure cystoscopy.  It is my knowledge she is still scheduled for September 4th.  I told patient we would confirm this and call her later.  Patient is comfortable at this time and desires to keep September 4th appointment as planned.  Patient is scheduled for cystoscopy bilateral retrograde pyelogram.  Please add possible left ureteroscopy, possible holmium laser lithotripsy, possible left stent insertion related to 4 mm left ureteral stones seen on previous CT imaging.  Risks/benefits were discussed.  Today patient voices understanding.

## 2024-02-07 NOTE — Telephone Encounter (Signed)
 Patient advised that procedure was still scheduled for 02/23/24.  She v/u and had no further questions.    Olivia Kirks, CMA

## 2024-02-23 ENCOUNTER — Encounter (HOSPITAL_COMMUNITY): Payer: Self-pay | Admitting: Anesthesiology

## 2024-02-23 ENCOUNTER — Telehealth (HOSPITAL_COMMUNITY): Payer: Self-pay | Admitting: UROLOGY

## 2024-02-23 ENCOUNTER — Other Ambulatory Visit: Admitting: UROLOGY

## 2024-02-23 DIAGNOSIS — Z23 Encounter for immunization: Secondary | ICD-10-CM | POA: Insufficient documentation

## 2024-02-23 DIAGNOSIS — N201 Calculus of ureter: Secondary | ICD-10-CM

## 2024-02-23 HISTORY — PX: CYSTOSCOPY: SUR368

## 2024-02-23 MED ORDER — HYDROCODONE 5 MG-ACETAMINOPHEN 325 MG TABLET
1.0000 | ORAL_TABLET | Freq: Four times a day (QID) | ORAL | 0 refills | Status: AC | PRN
Start: 2024-02-23 — End: 2024-02-26

## 2024-02-24 DIAGNOSIS — N201 Calculus of ureter: Secondary | ICD-10-CM

## 2024-02-24 LAB — SURGICAL PATHOLOGY SPECIMEN

## 2024-02-27 ENCOUNTER — Encounter (HOSPITAL_BASED_OUTPATIENT_CLINIC_OR_DEPARTMENT_OTHER): Payer: Self-pay | Admitting: UROLOGY

## 2024-02-27 ENCOUNTER — Telehealth (HOSPITAL_BASED_OUTPATIENT_CLINIC_OR_DEPARTMENT_OTHER): Payer: Self-pay | Admitting: Family

## 2024-02-27 ENCOUNTER — Encounter (HOSPITAL_BASED_OUTPATIENT_CLINIC_OR_DEPARTMENT_OTHER): Payer: Self-pay

## 2024-02-27 ENCOUNTER — Encounter (HOSPITAL_BASED_OUTPATIENT_CLINIC_OR_DEPARTMENT_OTHER): Payer: Self-pay | Admitting: Family

## 2024-02-27 NOTE — Telephone Encounter (Signed)
 It is not uncommon to have periodic bladder spasms as she is experiencing after removal stent.  This should subside over the next few days.  If she would develop any significant fever worsening pain or significant gross hematuria recommend calling office accordingly.

## 2024-02-27 NOTE — Nursing Note (Signed)
 I removed my stent yesterday. No issues with removal.    I am having random pain in my pubic area, left side mostly.     I also sent this to Dr. Raechel.     Is this common?     Andrea Huerta    PLEASE ADVISE.    Olivia Kirks, CMA

## 2024-02-28 NOTE — Telephone Encounter (Signed)
 Patient advised that it is not uncommon to have some bladder spasms after removing the stent and that may be what she is experiencing.  She was advised to watch for fever, worsening pain, hematuria and, if she has them, call our office.  She v/u and had no further questions.    Olivia Kirks, CMA

## 2024-03-01 LAB — STONE ANALYSIS W/ IMAGE: STONE WEIGHT: 0.014 g

## 2024-03-03 ENCOUNTER — Other Ambulatory Visit (HOSPITAL_BASED_OUTPATIENT_CLINIC_OR_DEPARTMENT_OTHER): Payer: Self-pay | Admitting: OTOLARYNGOLOGY

## 2024-03-05 NOTE — Telephone Encounter (Signed)
 RX approved and encounter closed

## 2024-03-07 ENCOUNTER — Encounter (HOSPITAL_BASED_OUTPATIENT_CLINIC_OR_DEPARTMENT_OTHER): Payer: Self-pay | Admitting: Cardiovascular Disease

## 2024-03-13 ENCOUNTER — Ambulatory Visit (INDEPENDENT_AMBULATORY_CARE_PROVIDER_SITE_OTHER): Payer: Self-pay | Admitting: Family Medicine

## 2024-03-20 ENCOUNTER — Other Ambulatory Visit (HOSPITAL_BASED_OUTPATIENT_CLINIC_OR_DEPARTMENT_OTHER): Payer: Self-pay | Admitting: Family

## 2024-03-20 DIAGNOSIS — R3129 Other microscopic hematuria: Secondary | ICD-10-CM

## 2024-03-25 ENCOUNTER — Other Ambulatory Visit: Payer: Self-pay

## 2024-03-26 ENCOUNTER — Ambulatory Visit: Payer: Self-pay | Attending: Family | Admitting: Family

## 2024-03-26 ENCOUNTER — Encounter (HOSPITAL_BASED_OUTPATIENT_CLINIC_OR_DEPARTMENT_OTHER): Payer: Self-pay | Admitting: Family

## 2024-03-26 ENCOUNTER — Ambulatory Visit (HOSPITAL_BASED_OUTPATIENT_CLINIC_OR_DEPARTMENT_OTHER)

## 2024-03-26 VITALS — Ht 62.0 in | Wt 192.0 lb

## 2024-03-26 DIAGNOSIS — Z9889 Other specified postprocedural states: Secondary | ICD-10-CM

## 2024-03-26 DIAGNOSIS — N201 Calculus of ureter: Secondary | ICD-10-CM | POA: Insufficient documentation

## 2024-03-26 DIAGNOSIS — R3129 Other microscopic hematuria: Secondary | ICD-10-CM | POA: Insufficient documentation

## 2024-03-26 DIAGNOSIS — N200 Calculus of kidney: Secondary | ICD-10-CM | POA: Insufficient documentation

## 2024-03-26 DIAGNOSIS — Z87448 Personal history of other diseases of urinary system: Secondary | ICD-10-CM

## 2024-03-26 LAB — URINALYSIS, MACROSCOPIC
BILIRUBIN: NOT DETECTED mg/dL
BLOOD: NOT DETECTED mg/dL
GLUCOSE: NOT DETECTED mg/dL
KETONES: NOT DETECTED mg/dL
LEUKOCYTES: NOT DETECTED WBCs/uL
NITRITE: NOT DETECTED
PH: 5.5 (ref 5.0–?)
PROTEIN: NOT DETECTED mg/dL
SPECIFIC GRAVITY: 1.025 (ref 1.005–?)
UROBILINOGEN: 0.2 mg/dL

## 2024-03-26 NOTE — H&P (Signed)
 Urology, [facility]  [address]  [place] [address]  [phone]    Date: 03/26/2024  Name: Andrea Huerta  Age: 63 y.o.      Chief of Complaint:   Chief Complaint   Patient presents with    Kidney Stones       Andrea Huerta is a 63 y.o. female  Who presents to the practice today for a follow up visit [facility]    Recent history of intermittent gross hematuria and microscopic hematuria.  Patient underwent CT imaging which showed nonobstructing 3 mm right renal stone.  4-5 mm left ureteral stone noted.  Patient is status post left ureteroscopy holmium laser lithotripsy left stent insertion with string per Dr. Raechel February 23, 2024.  Stone analysis calcium oxalate and 10% uric acid.  No prior history of kidney stones.  Urinalysis clear today.      Current health problems  Patient Active Problem List    Diagnosis Date Noted    OSA on CPAP 07/16/2021    Type 2 diabetes mellitus without complication, without long-term current use of insulin 03/16/2019    Essential hypertension 03/16/2019    Morbid obesity with BMI of 40.0-44.9, adult (CMS HCC) 03/16/2019    Hyperlipidemia 03/16/2019    Anxiety and depression 03/16/2019       Current Outpatient Medications   Medication Sig    ASPIRIN (ASPIR-81 ORAL) Once a day    Blood Sugar Diagnostic (FREESTYLE LITE STRIPS) Strip 1 Strip Twice daily    busPIRone  (BUSPAR ) 5 mg Oral Tablet Take 1 Tablet (5 mg total) by mouth Three times a day as needed (anxiety)    cranberry fruit (CRANBERRY) 450 mg Oral Tablet Take 2 Tablets (900 mg total) by mouth Three times daily with meals    escitalopram  oxalate (LEXAPRO ) 20 mg Oral Tablet Take 1 Tablet (20 mg total) by mouth Once a day    flash glucose scanning reader (FREESTYLE LIBRE 14 DAY READER) Does not apply Misc For checking blood sugars 4-5 times daily.    flash glucose sensor (FREESTYLE LIBRE 14 DAY SENSOR) Does not apply Kit For checking blood sugars 4-5 times daily. On 4 insulin injections daily.     fluticasone  propionate (FLONASE ) 50 mcg/actuation Nasal Spray, Suspension ADMINISTER 2 SPRAYS INTO EACH NOSTRIL ONCE A DAY.    GLUCOSAM/GLUC SU/AC-ALP-D-GLUC (GLUCOSAMINE COMPLEX ORAL) Once a day (Patient not taking: Reported on 02/01/2024)    lisinopriL  (PRINIVIL ) 20 mg Oral Tablet TAKE 1 TABLET BY MOUTH EVERY DAY    metFORMIN  (GLUCOPHAGE ) 500 mg Oral Tablet TAKE 1 TABLET (500 MG TOTAL) BY MOUTH THREE TIMES DAILY WITH MEALS    pantoprazole  (PROTONIX ) 40 mg Oral Tablet, Delayed Release (E.C.) TAKE 1 TABLET BY MOUTH EVERY DAY    rosuvastatin  (CRESTOR ) 20 mg Oral Tablet Take 1 Tablet (20 mg total) by mouth Every evening    vitamin E 100 unit Oral Capsule Take 1 Capsule (100 Units total) by mouth Daily       ALLERGIES:  Allergies[1]    Last lipid profile and glucose:  Lab Results   Component Value Date    CHOLESTEROL 165 12/17/2021    HDLCHOL 48 (L) 12/17/2021    LDLCHOL 95 12/17/2021    TRIG 125 12/17/2021        Past Medical History:   Diagnosis Date    Diabetes mellitus, type 2     H/O complete eye exam 01/2016    H/O mammogram 2017  High cholesterol     History of dental examination 04/2016    Hypertension     Sleep apnea     Vaginal Pap smear 10/2014         Past Surgical History:   Procedure Laterality Date    CYSTOSCOPY  02/23/2024    DUGAN-OVASC-CYSTO/BIL RETRO/LEFT URETEROSCOPY/LASER LITHO/URETEROSCOPIC STONE BASKET /LEFT STENT INSERT WITH STRING AND PELVIC EXAM    HX CESAREAN SECTION      HX TONSILLECTOMY      HX WISDOM TEETH EXTRACTION      MOLE REMOVAL      several all normal         ASPIRIN (ASPIR-81 ORAL), Once a day  Blood Sugar Diagnostic (FREESTYLE LITE STRIPS) Strip, 1 Strip Twice daily  busPIRone  (BUSPAR ) 5 mg Oral Tablet, Take 1 Tablet (5 mg total) by mouth Three times a day as needed (anxiety)  cranberry fruit (CRANBERRY) 450 mg Oral Tablet, Take 2 Tablets (900 mg total) by mouth Three times daily with meals  escitalopram  oxalate (LEXAPRO ) 20 mg Oral Tablet, Take 1 Tablet (20 mg total) by mouth  Once a day  flash glucose scanning reader (FREESTYLE LIBRE 14 DAY READER) Does not apply Misc, For checking blood sugars 4-5 times daily.  flash glucose sensor (FREESTYLE LIBRE 14 DAY SENSOR) Does not apply Kit, For checking blood sugars 4-5 times daily. On 4 insulin injections daily.  fluticasone  propionate (FLONASE ) 50 mcg/actuation Nasal Spray, Suspension, ADMINISTER 2 SPRAYS INTO EACH NOSTRIL ONCE A DAY.  GLUCOSAM/GLUC SU/AC-ALP-D-GLUC (GLUCOSAMINE COMPLEX ORAL), Once a day (Patient not taking: Reported on 02/01/2024)  lisinopriL  (PRINIVIL ) 20 mg Oral Tablet, TAKE 1 TABLET BY MOUTH EVERY DAY  metFORMIN  (GLUCOPHAGE ) 500 mg Oral Tablet, TAKE 1 TABLET (500 MG TOTAL) BY MOUTH THREE TIMES DAILY WITH MEALS  pantoprazole  (PROTONIX ) 40 mg Oral Tablet, Delayed Release (E.C.), TAKE 1 TABLET BY MOUTH EVERY DAY  rosuvastatin  (CRESTOR ) 20 mg Oral Tablet, Take 1 Tablet (20 mg total) by mouth Every evening  vitamin E 100 unit Oral Capsule, Take 1 Capsule (100 Units total) by mouth Daily    No facility-administered medications prior to visit.     Family Medical History:       Problem Relation (Age of Onset)    Breast Cancer Mother    Diabetes Mother, Maternal Grandfather    High Cholesterol Mother, Brother    Hypertension (High Blood Pressure) Mother, Father, Brother    Liver Cancer Father    Lymphoma Maternal Aunt    Melanoma Maternal Aunt    No Known Problems Sister, Maternal Grandmother, Paternal Grandmother, Paternal Grandfather, Daughter, Son, Maternal Uncle, Paternal Aunt, Paternal Education officer, community, Other            Social History     Socioeconomic History    Marital status: Married     Spouse name: Not on file    Number of children: Not on file    Years of education: Not on file    Highest education level: Not on file   Occupational History    Occupation: math Runner, broadcasting/film/video- Brewing technologist   Tobacco Use    Smoking status: Never    Smokeless tobacco: Never   Vaping Use    Vaping status: Never Used   Substance and Sexual Activity    Alcohol use:  Yes     Comment: 1 or 2 drinks per week    Drug use: Never    Sexual activity: Not on file   Other Topics Concern    Not  on file   Social History Narrative    Not on file     Social Determinants of Health     Financial Resource Strain: Not on file   Transportation Needs: Not on file   Social Connections: Not on file   Intimate Partner Violence: Not on file   Housing Stability: Not on file      Immunization History   Administered Date(s) Administered    COVID-19 VACCINE,MODERNA(SPIKEVAX),50MCG/0.5ML,12 YR+,SEASONAL 05/23/2023    Covid-19 Vaccine,Pfizer-BioNTech,Purple Top,54yrs+ 06/28/2019, 07/21/2019, 03/20/2020    FLUZONE HD VACCINE (ADMIN) 07/27/2016, 06/30/2017    HEPATITIS A VACCINE -ADULT 07/29/2017, 04/25/2018    Influenza Vaccine, 6 month-adult 04/25/2002, 05/06/2004, 05/06/2005, 04/17/2007, 04/11/2008, 02/03/2010, 02/16/2012, 03/30/2016, 04/18/2017, 05/27/2018, 03/31/2020    PPD(ADMIN) 12/14/2011    Pneumovax 06/12/2012, 11/15/2017    Shingrix - Zoster Vaccine 07/14/2017, 11/15/2017, 05/28/2019, 12/27/2019    Tetanus Toxoid/Diphtheria Toxoid/Acellular Pertussis Vaccine, Adsorbed 03/12/2020    VAXNEUVANCE  12/17/2021    ZOSTAVAX (VARICELLA ZOSTER VACCINE) 12/27/2019       ROS:   GENERAL: NOT PRESENT- CHILLS, FEVER  GI: NOT PRESENT: ABDOMINAL PAIN, NAUSEA, VOMITING  GU: SEE HPI    OBJECTIVE:   Ht 1.575 m (5' 2)   Wt 87.1 kg (192 lb)   LMP  (LMP Unknown)   BMI 35.12 kg/m        Body mass index is 35.12 kg/m.   Exam:   GENERAL: ALERT, COOPERATIVE, NOT IN ACUTE DISTRESS  NEUROLOGIC: AFFECT- NORMAL, SPEECH- NORMAL, GAIT- NORMAL  NEUROPSYCHIATRIC: ORIENTED X3, PATIENTS MOOD AND AFFECT IS NORMAL      Assessment   ASSESSMENT & PLAN:     ICD-10-CM    1. Left ureteral calculus  N20.1       2. Renal calculus, right  N20.0 XR KUB        Orders Placed This Encounter    XR KUB     Stone prevention discussed.  Will follow up in 6 months with KUB prior to monitor known small right renal stone.  Return in about 6 months  (around 09/24/2024) for History kidney stones.         Andrea JAYSON Bourdon, APRN, CNP  03/26/2024, 13:50            [1] No Known Allergies

## 2024-04-18 ENCOUNTER — Other Ambulatory Visit: Payer: Self-pay

## 2024-04-19 ENCOUNTER — Encounter (HOSPITAL_BASED_OUTPATIENT_CLINIC_OR_DEPARTMENT_OTHER): Payer: Self-pay | Admitting: OTOLARYNGOLOGY

## 2024-04-19 ENCOUNTER — Ambulatory Visit: Attending: OTOLARYNGOLOGY | Admitting: OTOLARYNGOLOGY

## 2024-04-19 DIAGNOSIS — H938X1 Other specified disorders of right ear: Secondary | ICD-10-CM

## 2024-04-19 DIAGNOSIS — J329 Chronic sinusitis, unspecified: Secondary | ICD-10-CM

## 2024-04-19 NOTE — Progress Notes (Signed)
 ENT, [facility]  [address]  [place] [address]  Operated by [facility]  Progress Note    Name: Andrea Huerta MRN:  [id number]   Date: 04/19/2024 DOB: [birth year] (63 y.o.)         Referring Provider:  Enrique Sora, MD    Reason for Visit:   Chief Complaint   Patient presents with    Chronic Sinusitis    Ear Fullness        History of Present Illness:  Andrea Huerta is a 63 y.o. female who he follows up regarding her right ear fullness and sinusitis.  She states she has improved after the long course of an antibiotic.  She uses Flonase  daily.  She is now back on Zyrtec  which has been helpful.  She denies hearing loss.  She has intermittent swishing noise in the right ear which does not last.      Patient History:  Problem List[1]  Current Outpatient Medications   Medication Sig    ASPIRIN (ASPIR-81 ORAL) Once a day    Blood Sugar Diagnostic (FREESTYLE LITE STRIPS) Strip 1 Strip Twice daily    busPIRone  (BUSPAR ) 5 mg Oral Tablet Take 1 Tablet (5 mg total) by mouth Three times a day as needed (anxiety)    cranberry fruit (CRANBERRY) 450 mg Oral Tablet Take 2 Tablets (900 mg total) by mouth Three times daily with meals    Cyclobenzap&Irritant Cntr Irr2 10 mg Kit Take 1 Kit (10 mg total) by mouth    escitalopram  oxalate (LEXAPRO ) 20 mg Oral Tablet Take 1 Tablet (20 mg total) by mouth Once a day    flash glucose scanning reader (FREESTYLE LIBRE 14 DAY READER) Does not apply Misc For checking blood sugars 4-5 times daily.    flash glucose sensor (FREESTYLE LIBRE 14 DAY SENSOR) Does not apply Kit For checking blood sugars 4-5 times daily. On 4 insulin injections daily.    fluticasone  propionate (FLONASE ) 50 mcg/actuation Nasal Spray, Suspension ADMINISTER 2 SPRAYS INTO EACH NOSTRIL ONCE A DAY.    GLUCOSAM/GLUC SU/AC-ALP-D-GLUC (GLUCOSAMINE COMPLEX ORAL) Once a day (Patient not taking: Reported on 04/19/2024)    lisinopriL  (PRINIVIL ) 20 mg Oral Tablet  TAKE 1 TABLET BY MOUTH EVERY DAY    metFORMIN  (GLUCOPHAGE ) 500 mg Oral Tablet TAKE 1 TABLET (500 MG TOTAL) BY MOUTH THREE TIMES DAILY WITH MEALS    MOUNJARO  7.5 mg/0.5 mL Subcutaneous Pen Injector     pantoprazole  (PROTONIX ) 40 mg Oral Tablet, Delayed Release (E.C.) TAKE 1 TABLET BY MOUTH EVERY DAY    rosuvastatin  (CRESTOR ) 20 mg Oral Tablet Take 1 Tablet (20 mg total) by mouth Every evening    vitamin E 100 unit Oral Capsule Take 1 Capsule (100 Units total) by mouth Daily      Allergies[2]  Past Medical History:   Diagnosis Date    Diabetes mellitus, type 2     H/O complete eye exam 01/2016    H/O mammogram 2017    High cholesterol     History of dental examination 04/2016    Hypertension     Sleep apnea     Vaginal Pap smear 10/2014     Past Surgical History:   Procedure Laterality Date    CYSTOSCOPY  02/23/2024    DUGAN-OVASC-CYSTO/BIL RETRO/LEFT URETEROSCOPY/LASER LITHO/URETEROSCOPIC STONE BASKET /LEFT STENT INSERT WITH STRING AND PELVIC EXAM    HX CESAREAN SECTION      HX TONSILLECTOMY  HX WISDOM TEETH EXTRACTION      MOLE REMOVAL      several all normal     Family Medical History:       Problem Relation (Age of Onset)    Breast Cancer Mother    Diabetes Mother, Maternal Grandfather    High Cholesterol Mother, Brother    Hypertension (High Blood Pressure) Mother, Father, Brother    Liver Cancer Father    Lymphoma Maternal Aunt    Melanoma Maternal Aunt    No Known Problems Sister, Maternal Grandmother, Paternal Grandmother, Paternal Grandfather, Daughter, Son, Maternal Uncle, Paternal Aunt, Paternal Uncle, Other            Social History[3]    Review of Systems:  The pertinent ROS as addressed in the HPI.    Physical Exam:  LMP  (LMP Unknown)       ENT Physical Exam  Ear  Auricles: right auricle normal; left auricle normal;  Ear Canals: right ear canal normal; left ear canal normal;  Tympanic Membranes: right tympanic membrane normal; left tympanic membrane normal;  Ear comments: Both ears examined with the  operating microscope.  Both tympanic membranes are normal.  Nose  External Nose: nares patent bilaterally; external nose normal;  Internal Nose: nasal mucosa normal; septum normal;  Oral Cavity/Oropharynx  Lips: normal;  Teeth: normal;  Gums: gingiva normal;  Tongue: normal;  Oral mucosa: normal;  Hard palate: normal;  Soft palate: normal;  Tonsils: normal;  Neck  Neck: neck normal; neck palpation normal;  Thyroid : thyroid  normal;  Respiratory  Inspection: breathing unlabored;  Lymphatic  Palpation: lymph nodes normal;  Neurovestibular  Mental Status: alert and oriented;  Psychiatric: affect is appropriate;       Assessment:  ENCOUNTER DIAGNOSES     ICD-10-CM   1. Ear fullness, right  H93.8X1   2. Chronic sinusitis, unspecified location  J32.9   Improved    Plan:  Medical records reviewed on 04/19/2024.  Can continue Flonase  and Zyrtec .  We discussed pulsatile tinnitus in detail.  If persistent recommended imaging studies such as MRI head and CT of the neck with contrast.  She is aware of this option.  Will consider this if symptoms worsen.    No orders of the defined types were placed in this encounter.      Alm Charlie Links, MD       [1]   Patient Active Problem List  Diagnosis    Type 2 diabetes mellitus without complication, without long-term current use of insulin    Essential hypertension    Morbid obesity with BMI of 40.0-44.9, adult (CMS HCC)    Hyperlipidemia    Anxiety and depression    OSA on CPAP   [2] No Known Allergies  [3]   Social History  Tobacco Use    Smoking status: Never    Smokeless tobacco: Never   Vaping Use    Vaping status: Never Used   Substance Use Topics    Alcohol use: Yes     Comment: 1 or 2 drinks per week    Drug use: Never

## 2024-05-16 ENCOUNTER — Other Ambulatory Visit (HOSPITAL_BASED_OUTPATIENT_CLINIC_OR_DEPARTMENT_OTHER): Payer: Self-pay | Admitting: OTOLARYNGOLOGY

## 2024-05-16 DIAGNOSIS — J329 Chronic sinusitis, unspecified: Secondary | ICD-10-CM

## 2024-05-16 NOTE — Addendum Note (Signed)
 Addended by: ISSAC LAURAINE LOOSE on: 05/16/2024 09:48 AM     Modules accepted: Orders

## 2024-05-26 ENCOUNTER — Other Ambulatory Visit (HOSPITAL_BASED_OUTPATIENT_CLINIC_OR_DEPARTMENT_OTHER): Payer: Self-pay | Admitting: Family Medicine

## 2024-05-26 DIAGNOSIS — K219 Gastro-esophageal reflux disease without esophagitis: Secondary | ICD-10-CM

## 2024-05-26 DIAGNOSIS — I10 Essential (primary) hypertension: Secondary | ICD-10-CM

## 2024-05-28 NOTE — Telephone Encounter (Signed)
 Last scheduled appointment with the encounter provider was 09/07/2023.  Currently scheduled next future appointment with the encounter provider is Visit date not found  The next office visit in this department is Visit date not found    Samule Olmsted, CMA  05/28/2024, 08:16

## 2024-07-15 ENCOUNTER — Other Ambulatory Visit (HOSPITAL_BASED_OUTPATIENT_CLINIC_OR_DEPARTMENT_OTHER): Payer: Self-pay | Admitting: Family Medicine

## 2024-07-15 DIAGNOSIS — E119 Type 2 diabetes mellitus without complications: Secondary | ICD-10-CM

## 2024-07-17 NOTE — Telephone Encounter (Deleted)
 Last scheduled appointment with the encounter provider was 09/07/2023.  Currently scheduled next future appointment with the encounter provider is Visit date not found  The next office visit in this department is Visit date not found    [facility], LPN  8/72/7973, 12:17

## 2024-10-01 ENCOUNTER — Ambulatory Visit (HOSPITAL_BASED_OUTPATIENT_CLINIC_OR_DEPARTMENT_OTHER): Payer: Self-pay | Admitting: Family
